# Patient Record
Sex: Female | Born: 2000 | Race: White | Hispanic: No | Marital: Single | Smoking: Never smoker
Health system: Southern US, Community
[De-identification: ages and names within clinical notes are randomized; demographics above are authoritative.]

## PROBLEM LIST (undated history)

## (undated) DIAGNOSIS — F419 Anxiety disorder, unspecified: Secondary | ICD-10-CM

## (undated) DIAGNOSIS — F32A Depression, unspecified: Secondary | ICD-10-CM

## (undated) HISTORY — DX: Depression, unspecified: F32.A

---
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
## 2019-02-15 ENCOUNTER — Other Ambulatory Visit: Payer: Self-pay

## 2019-02-15 ENCOUNTER — Ambulatory Visit (INDEPENDENT_AMBULATORY_CARE_PROVIDER_SITE_OTHER): Payer: 59 | Admitting: Psychiatry

## 2019-02-15 ENCOUNTER — Encounter: Payer: Self-pay | Admitting: Psychiatry

## 2019-02-15 DIAGNOSIS — F411 Generalized anxiety disorder: Secondary | ICD-10-CM

## 2019-02-15 DIAGNOSIS — F3281 Premenstrual dysphoric disorder: Secondary | ICD-10-CM | POA: Diagnosis not present

## 2019-02-15 DIAGNOSIS — F410 Panic disorder [episodic paroxysmal anxiety] without agoraphobia: Secondary | ICD-10-CM | POA: Diagnosis not present

## 2019-02-15 DIAGNOSIS — F902 Attention-deficit hyperactivity disorder, combined type: Secondary | ICD-10-CM | POA: Diagnosis not present

## 2019-02-15 MED ORDER — ESCITALOPRAM OXALATE 20 MG PO TABS: 20.0000 mg | ORAL_TABLET | Freq: Every day | ORAL | 1 refills | Status: DC

## 2019-02-15 NOTE — Progress Notes (Signed)
[facility]  Patient ID: Wendy Cherry,  MRN: [id number]  PCP: Ellene Route  Date of Evaluation: 02/15/2019 Time spent:10 minutes from 1520 to 1530  Chief Complaint:  Chief Complaint    Anxiety; Depression; ADHD; Panic Attack      HISTORY/CURRENT STATUS: Joslyn is provided telemedicine audiovisual appointment session, though she declines the video camera for generalized anxiety and panic disorder, individually with consent with epic collateral for psychiatric interview and exam in 39-month evaluation and management of PMDD, generalized and panic anxiety, and ADHD.  After starting Wellbutrin 6 months ago for ADHD and PMDD, the patient discontinued that as she has her [name] and nortriptyline for headache from neurology and her Klonopin for anxiety.  She denies any recent panic attacks but continues overthinking anxiety.  She left [employer] after a few months returning home now working at [employer] though brother remains at [employer] as a senior due to graduate in the spring.  Patient plans [employer] this next semester in January.  She has no mania, suicidality, psychosis or delirium, while she emphasizes the personal need and documents medical necessity to continue Lexapro for anxiety and depression.   Individual Medical History/ Review of Systems: Changes? :Yes She does continue birth control pill for PCOS and PMDD, in the past having been treated with Metadate, BuSpar, Klonopin, Remeron, Vyvanse, Dextrostat, and Wellbutrin.  Allergies: Patient has no known allergies.  Current Medications:  Current Outpatient Medications:  .  escitalopram (LEXAPRO) 20 MG tablet, Take 1 tablet (20 mg total) by mouth at bedtime., Disp: 90 tablet, Rfl: 1  Medication Side Effects: none  Family Medical/ Social History: Changes? No  MENTAL HEALTH EXAM:  There were no vitals taken for this visit.There is no height or weight on file to calculate BMI. Muscle strengths and tone 5/5, postural  reflexes and gait 0/0, and AIMS = 0 otherwise deferred for coronavirus shutdown  General Appearance: N/A  Eye Contact:  N/A  Speech:  Clear and Coherent, Normal Rate and Talkative  Volume:  Normal  Mood:  Anxious, Dysphoric, Euthymic and Irritable  Affect:  Inappropriate, Full Range and Anxious  Thought Process:  Coherent, Goal Directed, Irrelevant, Linear and Descriptions of Associations: Circumstantial  Orientation:  Full (Time, Place, and Person)  Thought Content: Obsessions and Rumination   Suicidal Thoughts:  No  Homicidal Thoughts:  No  Memory:  Immediate;   Good Remote;   Good  Judgement:  Good  Insight:  Good  Psychomotor Activity:  N/A  Concentration:  Concentration: Fair and Attention Span: Good  Recall:  Good  Fund of Knowledge: Good  Language: Good  Assets:  Desire for Improvement Intimacy Resilience Talents/Skills  ADL's:  Intact  Cognition: WNL  Prognosis:  Good    DIAGNOSES:    ICD-10-CM   1. Generalized anxiety disorder  F41.1 escitalopram (LEXAPRO) 20 MG tablet  2. Premenstrual dysphoric disorder  F32.81 escitalopram (LEXAPRO) 20 MG tablet  3. Panic disorder  F41.0 escitalopram (LEXAPRO) 20 MG tablet  4. Attention deficit hyperactivity disorder (ADHD), combined type, mild  F90.2     Receiving Psychotherapy: No  previously with Vivia Budge, MA   RECOMMENDATIONS: Psychosupportive psychoeducation integrates CBT exposure desensitization thought stopping response prevention for symptom treatment matching with continued Lexapro medication addition to OCP otherwise prescribed.  Lexapro is E scribed 20 mg every bedtime as #90 with 1 refill sent to [facility] service having 2-month supply already in place. She will return for follow-up in 1 year or sooner if  needed.  Virtual Visit via Video Note  I connected with NAVEYA ELLERMAN on 02/15/19 at  3:20 PM EST by a video enabled telemedicine application and verified that I am speaking with the correct person using  two identifiers.  Location: Patient: Individually phone to phone at family residence Provider: [facility]   I discussed the limitations of evaluation and management by telemedicine and the availability of in person appointments. The patient expressed understanding and agreed to proceed.  History of Present Illness:  90-month evaluation and management address PMDD, generalized and panic anxiety, and ADHD.  After starting Wellbutrin 6 months ago for ADHD and PMDD,   Observations/Objective: Mood:  Anxious, Dysphoric, Euthymic and Irritable  Affect:  Inappropriate, Full Range and Anxious  Thought Process:  Coherent, Goal Directed, Irrelevant, Linear and Descriptions of Associations: Circumstantial    Assessment and Plan: Psychosupportive psychoeducation integrates CBT exposure desensitization thought stopping response prevention for symptom treatment matching with continued Lexapro medication addition to OCP otherwise prescribed.  Lexapro is E scribed 20 mg every bedtime as #90 with 1 refill sent to [facility] service having 35-month supply already in place.  Follow Up Instructions: She will return for follow-up in 1 year or sooner if needed..   I discussed the assessment and treatment plan with the patient. The patient was provided an opportunity to ask questions and all were answered. The patient agreed with the plan and demonstrated an understanding of the instructions.   The patient was advised to call back or seek an in-person evaluation if the symptoms worsen or if the condition fails to improve as anticipated.  I provided 10 minutes of non-face-to-face time during this encounter. [place] Web[employer] meeting #[id number] Meeting password: [email or web address]  Chauncey Mann, MD  Chauncey Mann, MD

## 2019-04-11 ENCOUNTER — Ambulatory Visit (INDEPENDENT_AMBULATORY_CARE_PROVIDER_SITE_OTHER): Payer: 59 | Admitting: Psychiatry

## 2019-04-11 ENCOUNTER — Other Ambulatory Visit: Payer: Self-pay

## 2019-04-11 ENCOUNTER — Encounter: Payer: Self-pay | Admitting: Psychiatry

## 2019-04-11 VITALS — Ht 66.0 in | Wt 190.0 lb

## 2019-04-11 DIAGNOSIS — F902 Attention-deficit hyperactivity disorder, combined type: Secondary | ICD-10-CM | POA: Diagnosis not present

## 2019-04-11 DIAGNOSIS — F3281 Premenstrual dysphoric disorder: Secondary | ICD-10-CM

## 2019-04-11 DIAGNOSIS — F410 Panic disorder [episodic paroxysmal anxiety] without agoraphobia: Secondary | ICD-10-CM | POA: Diagnosis not present

## 2019-04-11 DIAGNOSIS — F411 Generalized anxiety disorder: Secondary | ICD-10-CM

## 2019-04-11 MED ORDER — DESVENLAFAXINE SUCCINATE ER 25 MG PO TB24: 25.0000 mg | ORAL_TABLET | Freq: Every day | ORAL | 0 refills | Status: DC

## 2019-04-11 NOTE — Progress Notes (Signed)
[facility]  Patient ID: Wendy Cherry,  MRN: [id number]  PCP: [facility]  Date of Evaluation: 04/11/2019 Time spent:20 minutes from 1340 to 1400  Chief Complaint:  Chief Complaint    Anxiety; Panic Attack; Depression; ADHD      HISTORY/CURRENT STATUS: Wendy Cherry is seen onsite in office 20 minutes face-to-face conjointly with mother with consent with epic collateral for psychiatric interview and exam in 44-month evaluation and management of PMDD, generalized and panic anxiety, and ADHD.  Wendy Cherry as always is significantly stoic and closed to communication about deep-seated emotional issues. but mother clarifies that patient is more depressed, having panic with sleeplessness at night with feeling of presyncope, and is having anger outbursts every 2 to 3 days.  She may not sleep until between 2 and 4 AM taking melatonin as needed over-the-counter and continuing her usual Lexapro 20 mg nightly.  Mother cries wondering if patient is just starting to grieve for the death of father 12/31/15 after which patient and brother gained about 30 pounds then losing it.  Patient started [employer] last August and stayed only 3 weeks while father is to graduate from there this spring.  She started [employer] in January but is having difficulty getting to classes due to her anxiety and depression.  She still works at [employer] doing better at school and work when she is with friends but then getting worse again afterward.  She has seen her providers from [employer] for PCOS endocrinology and migraine neurology.  She gained excessive weight from Remeron in the past though it did help, but she refuses to take it again.  She is not taking Vyvanse or Dextrostat for ADHD.  She stopped nortriptyline from neurology for migraine but remains on birth control pill for PCOS.  She is no longer taking her Klonopin for anxiety and panic.  She did not do well on BuSpar or Wellbutrin in the past seeming to make migraines  worse. Wendy Cherry is negative.  .  She has no mania, suicidalityMother worries the patient may have bipolar though family history includes mother with anxiety and brother with ADHD, psychosis or delirium.  Depression  The patient presents with depression starting more than 8 month ago.   The onset quality is undetermined.   The problem occurs daily.  The problem has been waxing and waning since onset.  Associated symptoms include decreased concentration, fatigue, hopelessness, irritable, restlessness, decreased interest, prolonged grief, and sad.  Associated symptoms include no helplessness, does not have insomnia, no appetite change, no body aches or myalgias, no headaches, no indigestion and no suicidal ideas.     The symptoms are aggravated by social issues, family issues and work stress.  Past treatments include other medications, SSRIs - Selective serotonin reuptake inhibitors and psychotherapy.  Compliance with treatment is variable and good.  Past compliance problems include medication issues, difficulty with treatment plan and medical issues.  Previous treatment provided moderate relief.  Risk factors include a change in medication usage/dosage, family history, family history of mental illness, history of mental illness, major life event and stress.   Past medical history includes chronic illness, recent illness, anxiety, depression and mental health disorder.     Pertinent negatives include no life-threatening condition, no physical disability, no recent psychiatric admission, no bipolar disorder, no eating disorder, no obsessive-compulsive disorder, no post-traumatic stress disorder, no schizophrenia, no suicide attempts and no head trauma.  Individual Medical History/ Review of Systems: Changes? :Yes Weight is up 8 pounds from  last June.  She has a history of vasovagal syncope along with obesity and PCOS.  She did have [facility] for migraine in the past.  Allergies: Patient has no known  allergies.  Current Medications:  Current Outpatient Medications:  .  Desvenlafaxine Succinate ER (PRISTIQ) 25 MG TB24, Take 25 mg by mouth daily after breakfast., Disp: 90 tablet, Rfl: 0 .  escitalopram (LEXAPRO) 20 MG tablet, Take 1 tablet (20 mg total) by mouth at bedtime., Disp: 90 tablet, Rfl: 1  Medication Side Effects: none  Family Medical/ Social History: Changes? Yes she was provided professional statement for accommodations at [facility] in February 2020 copy on chart. Mother worries the patient may have bipolar, but family history includes mother with anxiety and brother with ADHD  MENTAL HEALTH EXAM:  Height 5\' 6"  (1.676 m), weight 190 lb (86.2 kg).Body mass index is 30.67 kg/m. Muscle strengths and tone 5/5, postural reflexes and gait 0/0, and AIMS = 0.  General Appearance: Casual, Fairly Groomed and Guarded  Eye Contact:  Fair  Speech:  Blocked, Clear and Coherent and Normal Rate  Volume:  Normal  Mood:  Anxious, Depressed, Dysphoric, Hopeless and Irritable and Angry  Affect:  Congruent, Depressed, Inappropriate, Restricted and Anxious  Thought Process:  Coherent, Goal Directed, Irrelevant, Linear and Descriptions of Associations: Tangential  Orientation:  Full (Time, Place, and Person)  Thought Content: Ilusions, Rumination and Tangential   Suicidal Thoughts:  No  Homicidal Thoughts:  No  Memory:  Immediate;   Good Remote;   Good  Judgement:  Fair  Insight:  Fair and Lacking  Psychomotor Activity:  Normal, Increased, Mannerisms and Restlessness  Concentration:  Concentration: Fair and Attention Span: Fair  Recall:  Good  Fund of Knowledge: Good  Language: Good to fair  Assets:  Leisure Time Resilience Talents/Skills  ADL's:  Intact  Cognition: WNL  Prognosis:  Fair to good    DIAGNOSES:    ICD-10-CM   1. Generalized anxiety disorder  F41.1 Desvenlafaxine Succinate ER (PRISTIQ) 25 MG TB24  2. Premenstrual dysphoric disorder  F32.81 Desvenlafaxine  Succinate ER (PRISTIQ) 25 MG TB24  3. Panic disorder  F41.0 Desvenlafaxine Succinate ER (PRISTIQ) 25 MG TB24  4. Attention deficit hyperactivity disorder (ADHD), combined type, mild  F90.2 Desvenlafaxine Succinate ER (PRISTIQ) 25 MG TB24    Receiving Psychotherapy: No  but previously withAaronStewart, MA   RECOMMENDATIONS: Psychosupportive psychoeducation integrates cognitive behavioral exposure desensitization thought stopping response prevention for behavioral nutrition, sleep hygiene, object relations and anger management interventions integrating with symptom treatment matching for medications to conclude addition of Pristiq to Lexapro likely best.  She declines Klonopin, Remeron, Neurontin, or Restoril at this time.  Grief therapy such as with hospice is reviewed for access and start up along with returning for therapy with Vivia Budge, MA.  She is E scribed Pristiq 25 mg every morning after breakfast sent as #90 with no refill to [facility] on [address] in [place] for PMDD and generalized anxiety and panic disorder well as ADHD.  She continues current supply of Lexapro 20 mg every bedtime for anxiety and depression and melatonin OTC as needed for insomnia.  She returns for follow-up in 3 months or sooner if needed.  Delight Hoh, MD

## 2019-07-09 ENCOUNTER — Ambulatory Visit: Payer: 59 | Admitting: Psychiatry

## 2019-07-10 ENCOUNTER — Telehealth (INDEPENDENT_AMBULATORY_CARE_PROVIDER_SITE_OTHER): Payer: 59 | Admitting: Psychiatry

## 2019-07-10 ENCOUNTER — Telehealth: Payer: Self-pay | Admitting: Psychiatry

## 2019-07-10 ENCOUNTER — Encounter: Payer: Self-pay | Admitting: Psychiatry

## 2019-07-10 DIAGNOSIS — F3281 Premenstrual dysphoric disorder: Secondary | ICD-10-CM | POA: Diagnosis not present

## 2019-07-10 DIAGNOSIS — F902 Attention-deficit hyperactivity disorder, combined type: Secondary | ICD-10-CM | POA: Diagnosis not present

## 2019-07-10 DIAGNOSIS — F410 Panic disorder [episodic paroxysmal anxiety] without agoraphobia: Secondary | ICD-10-CM | POA: Diagnosis not present

## 2019-07-10 DIAGNOSIS — F411 Generalized anxiety disorder: Secondary | ICD-10-CM

## 2019-07-10 MED ORDER — ESCITALOPRAM OXALATE 20 MG PO TABS: 20.0000 mg | ORAL_TABLET | Freq: Every day | ORAL | 3 refills | Status: DC

## 2019-07-10 NOTE — Telephone Encounter (Signed)
Ms. zanetta, dehaan are scheduled for a virtual visit with your provider today.    Just as we do with appointments in the office, we must obtain your consent to participate.  Your consent will be active for this visit and any virtual visit you may have with one of our providers in the next 365 days.    If you have a MyChart account, I can also send a copy of this consent to you electronically.  All virtual visits are billed to your insurance company just like a traditional visit in the office.  As this is a virtual visit, video technology does not allow for your provider to perform a traditional examination.  This may limit your provider's ability to fully assess your condition.  If your provider identifies any concerns that need to be evaluated in person or the need to arrange testing such as labs, EKG, etc, we will make arrangements to do so.    Although advances in technology are sophisticated, we cannot ensure that it will always work on either your end or our end.  If the connection with a video visit is poor, we may have to switch to a telephone visit.  With either a video or telephone visit, we are not always able to ensure that we have a secure connection.   I need to obtain your verbal consent now.   Are you willing to proceed with your visit today?   ODALY PERI has provided verbal consent on 07/10/2019 for a virtual visit (video or telephone).   Chauncey Mann, MD 07/10/2019  9:58 AM

## 2019-07-10 NOTE — Progress Notes (Addendum)
[facility]  Patient ID: Wendy Cherry,  MRN: [id number]  PCP: Ellene Route  Date of Evaluation: 07/10/2019 Time spent:20 minutes from 1000 to 1020 Chief Complaint:  Chief Complaint    Depression; Anxiety; Panic Attack; ADHD      HISTORY/CURRENT STATUS: Wyolene is provided telemedicine A/V appointment session on MyChart/[employer] platform 20 minutes video to video with consent provided and documented in annual format prior to beginning with epic collateral for psychiatric interview and exam in 59-month evaluation and management of generalized anxiety, premenstrual dysphoric disorder, panic disorder and ADHD.  Some malfunction of audio did occur in the course of the session on my chart so that the appointment was completed for plans by telephone to be certain understood. Mother accompanied patient to last appointment but is at work today not participating therefore.  The patient reviews in the interim receiving a promotion herself at work at [employer] where she has no disruption from her anxiety or mood.  She dropped all classes for her [employer] January semester except American Sign Language a short class though completed.  She is scheduled to restart [facility] in August but offers no personal certainty of mastery this time for assuring she can attend class, but she does well with work.  At last appointment, mother was concerned that the patient had delayed grieving for the death of her father December 21, 2015 with mother in tears also grieving.  The patient is not as certain as mother that her depression and anxiety of last appointment were due to the loss of father.  She has other conflicts in that brother graduates this month from [employer] and she started out there for college but anxiety brought her home.  She has established care with [place] [facility]ology through [facility] for migraine and PCOS treated with birth control pill doing well.  At last appointment the patient  had discontinued all of her antianxiety and ADHD medication refusing any help for sleep but allowing the start up of Pristiq 25 mg for PMDD, panic, and generalized anxiety combined with existing Lexapro 20 mg every bedtime.  The patient took the [employer] for 1 month and then stopped it without telling mother until later when mother encouraged the patient to continue the medication but patient refusing as she considered it not necessary.  Therefore she does not have a clear delineation of cause and course of the consequences of relapse of anxiety and depression but doing better, so she expects to only need the Lexapro now so that discontinue the Pristiq today.  We discuss the possible need for Klonopin as school starts, higher dose of Lexapro, restart of Pristiq, or other options for assuring she can get to class in August. She is not manic, psychotic, delirious, or suicidal.   Depression             The patient presents withdepression starting more than a year ago with exacerbation early 2021. The onset quality is undetermined. The problem occurs daily.The problem has been waxing and waningsince onset.Associated symptoms include decreased concentration,fatigue,restlessness,decreased interest,prolonged grief,and reactive sadness. Associated symptoms include no helplessness, no insomnia,hopelessness, noirritability ,no appetite change,no body aches, no myalgias,no headaches,no indigestionand no suicidal ideas.The symptoms are aggravated by social issues, family issues and work stress.Past treatments include other medications, SSRIs - Selective serotonin reuptake inhibitors and psychotherapy.Compliance with treatment is variable and good.Past compliance problems include medication issues, difficulty with treatment plan and medical issues.Previous treatment provided moderaterelief.Risk factors include a change in medication usage/dosage, family history,  family history of mental  illness, history of mental illness, major life event and stress. Past medical history includes chronic illness,recent illness,anxiety,depressionand mental health disorder. Pertinent negatives include no life-threatening condition,no physical disability,no recent psychiatric admission,no bipolar disorder,no eating disorder,no obsessive-compulsive disorder,no post-traumatic stress disorder,no schizophrenia,no suicide attemptsand no head trauma.  Individual Medical History/ Review of Systems: Changes? :No   Allergies: Patient has no known allergies.  Current Medications:  Current Outpatient Medications:  .  escitalopram (LEXAPRO) 20 MG tablet, Take 1 tablet (20 mg total) by mouth at bedtime., Disp: 90 tablet, Rfl: 3  Medication Side Effects: none  Family Medical/ Social History: Changes? No  MENTAL HEALTH EXAM:  There were no vitals taken for this visit.There is no height or weight on file to calculate BMI.  General Appearance: Casual, Fairly Groomed, Guarded and Obese  Eye Contact:  Fair  Speech:  Clear and Coherent, Normal Rate and Talkative  Volume:  Normal  Mood:  Anxious, Dysphoric, Euthymic and Worthless  Affect:  Congruent, Depressed, Inappropriate and Anxious  Thought Process:  Coherent, Goal Directed, Irrelevant, Linear and Descriptions of Associations: Tangential  Orientation:  Full (Time, Place, and Person)  Thought Content: Ilusions, Rumination and Tangential   Suicidal Thoughts:  No  Homicidal Thoughts:  No  Memory:  Immediate;   Good Remote;   Good  Judgement:  Fair  Insight:  Fair  Psychomotor Activity:  Normal, Increased and Mannerisms  Concentration:  Concentration: Fair and Attention Span: Fair  Recall:  Good  Fund of Knowledge: Good  Language: Good  Assets:  Desire for Improvement Leisure Time Resilience Social Support  ADL's:  Intact  Cognition: WNL  Prognosis:  Fair    DIAGNOSES:    ICD-10-CM   1. Generalized anxiety disorder  F41.1  escitalopram (LEXAPRO) 20 MG tablet  2. Premenstrual dysphoric disorder  F32.81 escitalopram (LEXAPRO) 20 MG tablet  3. Panic disorder  F41.0 escitalopram (LEXAPRO) 20 MG tablet  4. 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  Patient has significant denial and displacement that has been evident since middle school.  She no longer has anger outbursts though in January she did exhibit a couple of outburst of modest intensity a day.  As we review all of these symptoms and consequences for generalizable solutions, the patient maintains she has inward mastery though reassessment prior to starting ACC is important.  Remeron caused too much weight gain in the past and she did not have any sustained benefit or ability to tolerate Vyvanse, Dextrostat, Wellbutrin, BuSpar, Pamelor, or willingness to sustain Klonopin.  Restoril at night may be the next best step in addition to the Lexapro with Cymbalta an additional option.  She is E scribed Lexapro 20 mg every bedtime as 90-day supply and 3 refills sent to [facility] as she will exhaust current supply likely in June.  Pristiq is discontinued at this time. We discuss follow-up here or in therapy if relapse begins again.  Otherwise she follows up in 3 months or sooner if necessary or willing.  [email or web address] [phone]  Chauncey Mann, MD

## 2019-08-20 ENCOUNTER — Other Ambulatory Visit
Admission: RE | Admit: 2019-08-20 | Discharge: 2019-08-20 | Disposition: A | Discharge status: Home / Self Care | Payer: Managed Care, Other (non HMO) | Source: Ambulatory Visit | Attending: Orthopedic Surgery | Admitting: Orthopedic Surgery

## 2019-08-20 ENCOUNTER — Other Ambulatory Visit: Payer: Self-pay

## 2019-08-20 DIAGNOSIS — Z01812 Encounter for preprocedural laboratory examination: Secondary | ICD-10-CM | POA: Diagnosis present

## 2019-08-20 DIAGNOSIS — Z20822 Contact with and (suspected) exposure to covid-19: Secondary | ICD-10-CM | POA: Diagnosis not present

## 2019-08-21 ENCOUNTER — Encounter
Admission: RE | Admit: 2019-08-21 | Discharge: 2019-08-21 | Disposition: A | Discharge status: Home / Self Care | Payer: Managed Care, Other (non HMO) | Source: Ambulatory Visit | Attending: Orthopedic Surgery | Admitting: Orthopedic Surgery

## 2019-08-21 ENCOUNTER — Other Ambulatory Visit: Payer: Self-pay | Admitting: Orthopedic Surgery

## 2019-08-21 HISTORY — DX: Anxiety disorder, unspecified: F41.9

## 2019-08-21 LAB — SARS CORONAVIRUS 2 (TAT 6-24 HRS): SARS Coronavirus 2: NEGATIVE

## 2019-08-21 NOTE — Patient Instructions (Signed)
COVID TESTING Date: 08/20/2019  Testing site:  [facility] [address] Thru Hours:  3:32 am - 1:00 pm Once you are tested, you are asked to stay quarantined (avoiding public places) until after your surgery.   Your procedure is scheduled on: Thursday August 23, 2019 Report to Day Surgery on the 2nd floor of the [facility] To find out your arrival time, please call [phone] between 1PM - 3PM on: Wednesday August 24, 2019  REMEMBER: Instructions that are not followed completely may result in serious medical risk, up to and including death; or upon the discretion of your surgeon and anesthesiologist your surgery may need to be rescheduled.  Do not eat food after midnight the night before surgery.  No gum chewing, lozengers or hard candies.  You may however, drink CLEAR liquids up to 2 hours before you are scheduled to arrive for your surgery. Do not drink anything within 2 hours of your scheduled arrival time.  Clear liquids include: - water  - apple juice without pulp - gatorade (not RED) - black coffee or tea (Do NOT add milk or creamers to the coffee or tea) Do NOT drink anything that is not on this list.  Type 1 and Type 2 diabetics should only drink water.    TAKE THESE MEDICATIONS THE MORNING OF SURGERY WITH A SIP OF WATER: NONE    Stop Anti-inflammatories (NSAIDS) such as Advil, Aleve, Ibuprofen, Motrin, Naproxen, Naprosyn and ASPIRIN  Aspirin based products such as Excedrin, Goodys Powder, BC Powder. (May take Tylenol or Acetaminophen if needed.)  Stop ANY OVER THE COUNTER supplements until after surgery. (May continue Vitamin D, Vitamin B, and multivitamin.)  No Alcohol for 24 hours before or after surgery.  No Smoking including e-cigarettes for 24 hours prior to surgery.  No chewable tobacco products for at least 6 hours prior to surgery.  No nicotine patches on the day of surgery.  Do not use any "recreational" drugs for at  least a week prior to your surgery.  Please be advised that the combination of cocaine and anesthesia may have negative outcomes, up to and including death. If you test positive for cocaine, your surgery will be cancelled.  On the morning of surgery brush your teeth with toothpaste and water, you may rinse your mouth with mouthwash if you wish. Do not swallow any toothpaste or mouthwash.  Do not wear jewelry, make-up, hairpins, clips or nail polish.  Do not wear lotions, powders, or perfumes AND DEODORANT    Do not shave 48 hours prior to surgery.   Contact lenses, hearing aids and dentures may not be worn into surgery.  Do not bring valuables to the hospital. [facility] is not responsible for any missing/lost belongings or valuables.   TAKE A SHOWER DAY OF SURGERY  Notify your doctor if there is any change in your medical condition (cold, fever, infection).  Wear comfortable clothing (specific to your surgery type) to the hospital.  Plan for stool softeners for home use; pain medications have a tendency to cause constipation. You can also help prevent constipation by eating foods high in fiber such as fruits and vegetables and drinking plenty of fluids as your diet allows.  After surgery, you can help prevent lung complications by doing breathing exercises.  Take deep breaths and cough every 1-2 hours. Your doctor may order a device called an Incentive Spirometer to help you take deep breaths. When coughing or sneezing, hold a pillow firmly  against your incision with both hands. This is called "splinting." Doing this helps protect your incision. It also decreases belly discomfort.  If you are being discharged the day of surgery, you will not be allowed to drive home. You will need a responsible adult (18 years or older) to drive you home and stay with you that night.   If you are taking public transportation, you will need to have a responsible adult (18 years or older) with  you. Please confirm with your physician that it is acceptable to use public transportation.   Please call the [place] Dept. at [phone] if you have any questions about these instructions.  Visitation Policy:  Patients undergoing a surgery or procedure may have one family member or support person with them as long as that person is not COVID-19 positive or experiencing its symptoms.  That person may remain in the waiting area during the procedure.  Children under 20 years of age may have both parents or legal guardians with them during their procedure.  Inpatient Visitation Update:   Two designated support people may visit a patient during visiting hours 7 am to 8 pm. It must be the same two designated people for the duration of the patient stay. The visitors may come and go during the day, and there is no switching out to have different visitors. A mask must be worn at all times, including in the patient room.  Children under 75 years of age:  a total of 4 designated visitors for the child's entire stay are allowed. Only 2 in the room at a time and only one staying overnight at a time. The overnight guest can now rotate during the child's hospital stay.  As a reminder, masks are still required for all [facility] team members, patients and visitors in all [facility] facilities.   Systemwide, no visitors 17 or younger.

## 2019-08-22 ENCOUNTER — Other Ambulatory Visit: Payer: Managed Care, Other (non HMO)

## 2019-08-23 ENCOUNTER — Ambulatory Visit: Payer: Managed Care, Other (non HMO) | Admitting: Anesthesiology

## 2019-08-23 ENCOUNTER — Encounter: Payer: Self-pay | Admitting: Orthopedic Surgery

## 2019-08-23 ENCOUNTER — Ambulatory Visit: Payer: Managed Care, Other (non HMO)

## 2019-08-23 ENCOUNTER — Encounter
Admission: RE | Disposition: A | Discharge status: Home / Self Care | Payer: Self-pay | Source: Ambulatory Visit | Attending: Orthopedic Surgery

## 2019-08-23 ENCOUNTER — Ambulatory Visit
Admission: RE | Admit: 2019-08-23 | Discharge: 2019-08-23 | Disposition: A | Discharge status: Home / Self Care | Payer: Managed Care, Other (non HMO) | Source: Ambulatory Visit | Attending: Orthopedic Surgery | Admitting: Orthopedic Surgery

## 2019-08-23 ENCOUNTER — Other Ambulatory Visit: Payer: Self-pay

## 2019-08-23 DIAGNOSIS — F419 Anxiety disorder, unspecified: Secondary | ICD-10-CM | POA: Insufficient documentation

## 2019-08-23 DIAGNOSIS — S43432A Superior glenoid labrum lesion of left shoulder, initial encounter: Secondary | ICD-10-CM | POA: Insufficient documentation

## 2019-08-23 DIAGNOSIS — Z79899 Other long term (current) drug therapy: Secondary | ICD-10-CM | POA: Insufficient documentation

## 2019-08-23 DIAGNOSIS — F329 Major depressive disorder, single episode, unspecified: Secondary | ICD-10-CM | POA: Insufficient documentation

## 2019-08-23 DIAGNOSIS — M25312 Other instability, left shoulder: Secondary | ICD-10-CM | POA: Diagnosis not present

## 2019-08-23 DIAGNOSIS — X58XXXA Exposure to other specified factors, initial encounter: Secondary | ICD-10-CM | POA: Insufficient documentation

## 2019-08-23 DIAGNOSIS — Z419 Encounter for procedure for purposes other than remedying health state, unspecified: Secondary | ICD-10-CM

## 2019-08-23 HISTORY — PX: SHOULDER ARTHROSCOPY WITH LABRAL REPAIR: SHX5691

## 2019-08-23 LAB — CBC WITH DIFFERENTIAL/PLATELET
Abs Immature Granulocytes: 0 10*3/uL (ref 0.00–0.07)
Basophils Absolute: 0 10*3/uL (ref 0.0–0.1)
Basophils Relative: 1 %
Eosinophils Absolute: 0.1 10*3/uL (ref 0.0–0.5)
Eosinophils Relative: 1 %
HCT: 38.2 % (ref 36.0–46.0)
Hemoglobin: 13.1 g/dL (ref 12.0–15.0)
Immature Granulocytes: 0 %
Lymphocytes Relative: 38 %
Lymphs Abs: 2.4 10*3/uL (ref 0.7–4.0)
MCH: 28.5 pg (ref 26.0–34.0)
MCHC: 34.3 g/dL (ref 30.0–36.0)
MCV: 83 fL (ref 80.0–100.0)
Monocytes Absolute: 0.4 10*3/uL (ref 0.1–1.0)
Monocytes Relative: 7 %
Neutro Abs: 3.4 10*3/uL (ref 1.7–7.7)
Neutrophils Relative %: 53 %
Platelets: 221 10*3/uL (ref 150–400)
RBC: 4.6 MIL/uL (ref 3.87–5.11)
RDW: 12.4 % (ref 11.5–15.5)
WBC: 6.2 10*3/uL (ref 4.0–10.5)
nRBC: 0 % (ref 0.0–0.2)

## 2019-08-23 LAB — BASIC METABOLIC PANEL
Anion gap: 7 (ref 5–15)
BUN: 10 mg/dL (ref 6–20)
CO2: 24 mmol/L (ref 22–32)
Calcium: 8.8 mg/dL — ABNORMAL LOW (ref 8.9–10.3)
Chloride: 109 mmol/L (ref 98–111)
Creatinine, Ser: 0.85 mg/dL (ref 0.44–1.00)
GFR calc Af Amer: >60 mL/min (ref >60)
GFR calc non Af Amer: >60 mL/min (ref >60)
Glucose, Bld: 87 mg/dL (ref 70–99)
Potassium: 3.9 mmol/L (ref 3.5–5.1)
Sodium: 140 mmol/L (ref 135–145)

## 2019-08-23 LAB — PROTIME-INR
INR: 1 (ref 0.8–1.2)
Prothrombin Time: 12.7 seconds (ref 11.4–15.2)

## 2019-08-23 LAB — APTT: aPTT: 29 seconds (ref 24–36)

## 2019-08-23 LAB — POCT PREGNANCY, URINE: Preg Test, Ur: NEGATIVE

## 2019-08-23 SURGERY — ARTHROSCOPY, SHOULDER, WITH GLENOID LABRUM REPAIR
Anesthesia: General | Site: Shoulder | Laterality: Left

## 2019-08-23 MED ORDER — CHLORHEXIDINE GLUCONATE CLOTH 2 % EX PADS
6.0000 | MEDICATED_PAD | Freq: Once | CUTANEOUS | Status: AC
  Administered 2019-08-23: 6 via TOPICAL

## 2019-08-23 MED ORDER — LIDOCAINE HCL (PF) 1 % IJ SOLN
INTRAMUSCULAR | Status: AC
  Filled 2019-08-23: qty 5

## 2019-08-23 MED ORDER — ONDANSETRON HCL 4 MG/2ML IJ SOLN: 4.0000 mg | Freq: Once | INTRAMUSCULAR | Status: DC | PRN

## 2019-08-23 MED ORDER — FENTANYL CITRATE (PF) 100 MCG/2ML IJ SOLN
INTRAMUSCULAR | Status: AC
  Filled 2019-08-23: qty 2

## 2019-08-23 MED ORDER — ONDANSETRON HCL 4 MG PO TABS: 4.0000 mg | ORAL_TABLET | Freq: Three times a day (TID) | ORAL | 0 refills | Status: DC | PRN

## 2019-08-23 MED ORDER — BUPIVACAINE HCL (PF) 0.5 % IJ SOLN
INTRAMUSCULAR | Status: AC
  Filled 2019-08-23: qty 10

## 2019-08-23 MED ORDER — CHLORHEXIDINE GLUCONATE 0.12 % MT SOLN: 15.0000 mL | Freq: Once | OROMUCOSAL | Status: AC

## 2019-08-23 MED ORDER — MIDAZOLAM HCL 2 MG/2ML IJ SOLN
INTRAMUSCULAR | Status: AC
  Administered 2019-08-23: 1 mg via INTRAVENOUS
  Filled 2019-08-23: qty 2

## 2019-08-23 MED ORDER — MIDAZOLAM HCL 2 MG/2ML IJ SOLN
INTRAMUSCULAR | Status: AC
  Filled 2019-08-23: qty 2

## 2019-08-23 MED ORDER — CEFAZOLIN SODIUM-DEXTROSE 2-4 GM/100ML-% IV SOLN
2.0000 g | INTRAVENOUS | Status: AC
  Administered 2019-08-23: 2 g via INTRAVENOUS

## 2019-08-23 MED ORDER — FAMOTIDINE 20 MG PO TABS: 20.0000 mg | ORAL_TABLET | Freq: Once | ORAL | Status: AC

## 2019-08-23 MED ORDER — PROPOFOL 10 MG/ML IV BOLUS
INTRAVENOUS | Status: AC
  Filled 2019-08-23: qty 20

## 2019-08-23 MED ORDER — SUGAMMADEX SODIUM 200 MG/2ML IV SOLN
INTRAVENOUS | Status: DC | PRN
  Administered 2019-08-23: 200 mg via INTRAVENOUS

## 2019-08-23 MED ORDER — FAMOTIDINE 20 MG PO TABS
ORAL_TABLET | ORAL | Status: AC
  Administered 2019-08-23: 20 mg via ORAL
  Filled 2019-08-23: qty 1

## 2019-08-23 MED ORDER — BUPIVACAINE LIPOSOME 1.3 % IJ SUSP
INTRAMUSCULAR | Status: AC
  Filled 2019-08-23: qty 20

## 2019-08-23 MED ORDER — ROCURONIUM BROMIDE 100 MG/10ML IV SOLN
INTRAVENOUS | Status: DC | PRN
  Administered 2019-08-23: 20 mg via INTRAVENOUS
  Administered 2019-08-23: 50 mg via INTRAVENOUS

## 2019-08-23 MED ORDER — DEXAMETHASONE SODIUM PHOSPHATE 10 MG/ML IJ SOLN
INTRAMUSCULAR | Status: DC | PRN
  Administered 2019-08-23: 10 mg via INTRAVENOUS

## 2019-08-23 MED ORDER — FENTANYL CITRATE (PF) 100 MCG/2ML IJ SOLN: 25.0000 ug | INTRAMUSCULAR | Status: DC | PRN

## 2019-08-23 MED ORDER — LACTATED RINGERS IV SOLN: INTRAVENOUS | Status: DC

## 2019-08-23 MED ORDER — OXYCODONE HCL 5 MG PO TABS: 5.0000 mg | ORAL_TABLET | ORAL | 0 refills | Status: DC | PRN

## 2019-08-23 MED ORDER — FENTANYL CITRATE (PF) 100 MCG/2ML IJ SOLN
INTRAMUSCULAR | Status: AC
  Administered 2019-08-23: 50 ug via INTRAVENOUS
  Filled 2019-08-23: qty 2

## 2019-08-23 MED ORDER — ONDANSETRON HCL 4 MG/2ML IJ SOLN
INTRAMUSCULAR | Status: DC | PRN
  Administered 2019-08-23: 4 mg via INTRAVENOUS

## 2019-08-23 MED ORDER — MIDAZOLAM HCL 2 MG/2ML IJ SOLN
INTRAMUSCULAR | Status: DC | PRN
  Administered 2019-08-23: 2 mg via INTRAVENOUS

## 2019-08-23 MED ORDER — ORAL CARE MOUTH RINSE: 15.0000 mL | Freq: Once | OROMUCOSAL | Status: AC

## 2019-08-23 MED ORDER — LACTATED RINGERS IV SOLN
INTRAVENOUS | Status: DC | PRN
  Administered 2019-08-23: 4 mL

## 2019-08-23 MED ORDER — PROPOFOL 10 MG/ML IV BOLUS
INTRAVENOUS | Status: DC | PRN
  Administered 2019-08-23: 100 mg via INTRAVENOUS

## 2019-08-23 MED ORDER — LIDOCAINE HCL (CARDIAC) PF 100 MG/5ML IV SOSY
PREFILLED_SYRINGE | INTRAVENOUS | Status: DC | PRN
  Administered 2019-08-23: 60 mg via INTRAVENOUS

## 2019-08-23 MED ORDER — EPHEDRINE SULFATE 50 MG/ML IJ SOLN
INTRAMUSCULAR | Status: DC | PRN
  Administered 2019-08-23: 5 mg via INTRAVENOUS

## 2019-08-23 MED ORDER — CHLORHEXIDINE GLUCONATE CLOTH 2 % EX PADS: 6.0000 | MEDICATED_PAD | Freq: Once | CUTANEOUS | Status: DC

## 2019-08-23 MED ORDER — MIDAZOLAM HCL 2 MG/2ML IJ SOLN
1.0000 mg | INTRAMUSCULAR | Status: AC | PRN
  Administered 2019-08-23: 1 mg via INTRAVENOUS

## 2019-08-23 MED ORDER — CHLORHEXIDINE GLUCONATE 0.12 % MT SOLN
OROMUCOSAL | Status: AC
  Administered 2019-08-23: 15 mL via OROMUCOSAL
  Filled 2019-08-23: qty 15

## 2019-08-23 MED ORDER — CEFAZOLIN SODIUM-DEXTROSE 2-4 GM/100ML-% IV SOLN
INTRAVENOUS | Status: AC
  Filled 2019-08-23: qty 100

## 2019-08-23 MED ORDER — FENTANYL CITRATE (PF) 100 MCG/2ML IJ SOLN
50.0000 ug | INTRAMUSCULAR | Status: AC | PRN
  Administered 2019-08-23 (×2): 50 ug via INTRAVENOUS

## 2019-08-23 SURGICAL SUPPLY — 61 items
ADAPTER IRRIG TUBE 2 SPIKE SOL (ADAPTER) ×4 IMPLANT
ANCHOR SUT BIOC ST 3X145 (Anchor) ×10 IMPLANT
BUR RADIUS 4.0X18.5 (BURR) ×2 IMPLANT
BUR RADIUS 5.5 (BURR) ×2 IMPLANT
CANISTER SUCT LVC 12 LTR MEDI- (MISCELLANEOUS) IMPLANT
CANNULA 5.75X7 CRYSTAL CLEAR (CANNULA) ×4 IMPLANT
CANNULA PARTIAL THREAD 2X7 (CANNULA) ×4 IMPLANT
CANNULA TWIST IN 8.25X9CM (CANNULA) IMPLANT
COOLER POLAR GLACIER W/PUMP (MISCELLANEOUS) ×2 IMPLANT
COVER WAND RF STERILE (DRAPES) ×2 IMPLANT
CRADLE LAMINECT ARM (MISCELLANEOUS) ×2 IMPLANT
DEVICE SUCT BLK HOLE OR FLOOR (MISCELLANEOUS) IMPLANT
DRAPE 3/4 80X56 (DRAPES) ×2 IMPLANT
DRAPE IMP U-DRAPE 54X76 (DRAPES) ×4 IMPLANT
DRAPE INCISE IOBAN 66X45 STRL (DRAPES) ×2 IMPLANT
DRAPE U-SHAPE 47X51 STRL (DRAPES) IMPLANT
DURAPREP 26ML APPLICATOR (WOUND CARE) ×6 IMPLANT
ELECT REM PT RETURN 9FT ADLT (ELECTROSURGICAL)
ELECTRODE REM PT RTRN 9FT ADLT (ELECTROSURGICAL) IMPLANT
GAUZE SPONGE 4X4 12PLY STRL (GAUZE/BANDAGES/DRESSINGS) ×2 IMPLANT
GAUZE XEROFORM 1X8 LF (GAUZE/BANDAGES/DRESSINGS) ×2 IMPLANT
GLOVE BIOGEL PI IND STRL 9 (GLOVE) ×1 IMPLANT
GLOVE BIOGEL PI INDICATOR 9 (GLOVE) ×1
GLOVE SURG 9.0 ORTHO LTXF (GLOVE) ×4 IMPLANT
GOWN STRL REUS TWL 2XL XL LVL4 (GOWN DISPOSABLE) ×2 IMPLANT
GOWN STRL REUS W/ TWL LRG LVL3 (GOWN DISPOSABLE) ×1 IMPLANT
GOWN STRL REUS W/ TWL LRG LVL4 (GOWN DISPOSABLE) ×1 IMPLANT
GOWN STRL REUS W/TWL LRG LVL3 (GOWN DISPOSABLE) ×1
GOWN STRL REUS W/TWL LRG LVL4 (GOWN DISPOSABLE) ×1
IV LACTATED RINGER IRRG 3000ML (IV SOLUTION) ×13
IV LR IRRIG 3000ML ARTHROMATIC (IV SOLUTION) ×13 IMPLANT
KIT STABILIZATION SHOULDER (MISCELLANEOUS) ×2 IMPLANT
KIT SUTURETAK 3.0 INSERT PERC (KITS) ×2 IMPLANT
KIT TURNOVER KIT A (KITS) ×2 IMPLANT
MANIFOLD NEPTUNE II (INSTRUMENTS) ×2 IMPLANT
MASK FACE SPIDER DISP (MASK) ×2 IMPLANT
MAT ABSORB  FLUID 56X50 GRAY (MISCELLANEOUS) ×2
MAT ABSORB FLUID 56X50 GRAY (MISCELLANEOUS) ×2 IMPLANT
NEEDLE HYPO 22GX1.5 SAFETY (NEEDLE) ×2 IMPLANT
PACK SHDR ARTHRO (MISCELLANEOUS) ×2 IMPLANT
PAD WRAPON POLAR SHDR XLG (MISCELLANEOUS) ×1 IMPLANT
SET TUBE SUCT SHAVER OUTFL 24K (TUBING) ×2 IMPLANT
SET TUBE TIP INTRA-ARTICULAR (MISCELLANEOUS) ×2 IMPLANT
STRAP SAFETY 5IN WIDE (MISCELLANEOUS) ×2 IMPLANT
STRIP CLOSURE SKIN 1/2X4 (GAUZE/BANDAGES/DRESSINGS) ×2 IMPLANT
SUT ETHILON 4-0 (SUTURE) ×2
SUT ETHILON 4-0 FS2 18XMFL BLK (SUTURE) ×2
SUT LASSO 90 DEG SD STR (SUTURE) ×2 IMPLANT
SUT MNCRL 4-0 (SUTURE) ×1
SUT MNCRL 4-0 27XMFL (SUTURE) ×1
SUT PDS AB 0 CT1 27 (SUTURE) IMPLANT
SUT VIC AB 0 CT1 36 (SUTURE) IMPLANT
SUT VIC AB 2-0 CT2 27 (SUTURE) IMPLANT
SUTURE ETHLN 4-0 FS2 18XMF BLK (SUTURE) ×2 IMPLANT
SUTURE MAGNUM WIRE 2X48 BLK (SUTURE) IMPLANT
SUTURE MNCRL 4-0 27XMF (SUTURE) ×1 IMPLANT
TAPE MICROFOAM 4IN (TAPE) ×2 IMPLANT
TUBING ARTHRO INFLOW-ONLY STRL (TUBING) ×2 IMPLANT
TUBING CONNECTING 10 (TUBING) ×2 IMPLANT
WAND HAND CNTRL MULTIVAC 90 (MISCELLANEOUS) ×2 IMPLANT
WRAPON POLAR PAD SHDR XLG (MISCELLANEOUS) ×2

## 2019-08-23 NOTE — Discharge Instructions (Signed)
Interscalene Nerve Block with Exparel  1.  For your surgery you have received an Interscalene Nerve Block with Exparel. 2. Nerve Blocks affect many types of nerves, including nerves that control movement, pain and normal sensation.  You may experience feelings such as numbness, tingling, heaviness, weakness or the inability to move your arm or the feeling or sensation that your arm has "fallen asleep". 3. A nerve block with Exparel can last up to 5 days.  Usually the weakness wears off first.  The tingling and heaviness usually wear off next.  Finally you may start to notice pain.  Keep in mind that this may occur in any order.  Once a nerve block starts to wear off it is usually completely gone within 60 minutes. 4. ISNB may cause mild shortness of breath, a hoarse voice, blurry vision, unequal pupils, or drooping of the face on the same side as the nerve block.  These symptoms will usually resolve with the numbness.  Very rarely the procedure itself can cause mild seizures. 5. If needed, your surgeon will give you a prescription for pain medication.  It will take about 60 minutes for the oral pain medication to become fully effective.  So, it is recommended that you start taking this medication before the nerve block first begins to wear off, or when you first begin to feel discomfort. 6. Take your pain medication only as prescribed.  Pain medication can cause sedation and decrease your breathing if you take more than you need for the level of pain that you have. 7. Nausea is a common side effect of many pain medications.  You may want to eat something before taking your pain medicine to prevent nausea. 8. After an Interscalene nerve block, you cannot feel pain, pressure or extremes in temperature in the effected arm.  Because your arm is numb it is at an increased risk for injury.  To decrease the possibility of injury, please practice the following:  a. While you are awake change the position of  your arm frequently to prevent too much pressure on any one area for prolonged periods of time. b.  If you have a cast or tight dressing, check the color or your fingers every couple of hours.  Call your surgeon with the appearance of any discoloration (white or blue). c. If you are given a sling to wear before you go home, please wear it  at all times until the block has completely worn off.  Do not get up at night without your sling. d. Please contact [facility] or your surgeon if you do not begin to regain sensation after 7 days from the surgery.  Anesthesia may be contacted by calling the Same Day Surgery Department, Mon. through Fri., 6 am to 4 pm at [phone].   e. If you experience any other problems or concerns, please contact your surgeon's office. f. If you experience severe or prolonged shortness of breath go to the nearest emergency department.   AMBULATORY SURGERY  DISCHARGE INSTRUCTIONS   1) The drugs that you were given will stay in your system until tomorrow so for the next 24 hours you should not:  A) Drive an automobile B) Make any legal decisions C) Drink any alcoholic beverage   2) You may resume regular meals tomorrow.  Today it is better to start with liquids and gradually work up to solid foods.  You may eat anything you prefer, but it is better to start with liquids, then soup   and crackers, and gradually work up to solid foods.   3) Please notify your doctor immediately if you have any unusual bleeding, trouble breathing, redness and pain at the surgery site, drainage, fever, or pain not relieved by medication.    4) Additional Instructions:        Please contact your physician with any problems or Same Day Surgery at [phone], Monday through Friday 6 am to 4 pm, or [facility] Main number at [phone]. 

## 2019-08-23 NOTE — Op Note (Signed)
08/23/2019  2:44 PM  PATIENT:  Wendy Cherry  19 y.o. 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 female with a diagnosis of Superior Glenoid Labrum Lesion of the left shoulder who failed conservative measures and elected for surgical management.    I discussed the risks and benefits of surgery. The risks include but are not limited to infection, bleeding, nerve or blood vessel injury, joint stiffness or loss of motion, persistent pain, weakness or instability, and hardware failure and the need for further surgery. Medical risks include but are not limited to DVT and pulmonary embolism, myocardial infarction, stroke, pneumonia, respiratory failure and death. Patient understood these risks and wished to proceed.   OPERATIVE IMPLANTS: Arthrex bio suturetak anchors  5   OPERATIVE FINDINGS:  Left shoulder superior labral tear with anterior instability and patulous capsule  OPERATIVE PROCEDURE:  I met with the patient preoperative area.  A pre-op history and physical was performed.  I signed the left shoulder according the hospital's correct site of surgery protocol.  I answered all questions by the patient.An interscalene block with Exparel was performed by the anesthesia service in the preoperative area.   Patient was then brought to the operating room where she underwent general endotracheal intubation.  She was then positioned in a beachchair position. All bony prominences were adequately padded including the lower extremities.  Examination under anesthesia revealed  anterior subluxation of the humeral head with load and shift testing, and a mild sulcus sign testing respectively.  The patient was then prepped and draped in a sterile fashion. The patient received 2 g of Ancef prior to the onset of the case.  A timeout was performed to verify the patient's name, date of birth, medical record number, correct site of surgery and correct procedure to be performed.The timeout was also used to verify the patient received antibiotics that all appropriate instruments, implants and radiographic studies were available in the room. Once all in attendance were in agreement case began.  Bony landmarks were drawn out with a surgical marker along with proposed incisions.  An 11 blade was used to establish a posterior portal through which the arthroscope was placed in the glenohumeral joint. An anterior portal was established under direct visualization using an 18-gauge spinal needle for localization. A 5.75 mm arthroscopic cannula was inserted through the anterior portal. A full diagnostic examination of the glenohumeral joint was performed.  Findings on arthroscopy included superior labral tear involving the biceps anchor with increased anterior laxity and a patulous capsule.  An anterolateral portal was established again under direct visualization using an 18-gauge spinal needle. A 7 mm cannula was placed through this anterolateral portal.  A 4.0 mm resector shaver blade was used to debride the superior, nonarticular glenoid until punctate bleeding was identified in preparation for repair of the superior labrum.  An arthroscopic rasp was used to abrade the anterior capsule    Three Arthrex Bio Suturetak anchors were then placed from the 6:00 to the 9 o'clock position 1 at a time in succession starting with the inferior most anchor.Wendy Cherry   A  90 degree Arthrex suture lasso was then used to shuttle a single limb of each anchor through the anterior inferior capsule.  Arthroscopic knot tying  technique was then used to complete the capsulorrhaphy anteriorly.  Final arthroscopic images of the anterior capsulorrhaphy were taken.  Patient no longer had excessive anterior translation.  The attention was then turned to the superior labrum.  Two superior anchors were placed, one anterior to the biceps attachment and one posterior to the biceps.  The posterior anchor was placed with an Arthrex percutaneous drill kit.  Again a 90 degree Arthrex suture lasso was used to shuttle 1 limb of each anchor under the superior labrum.  Arthroscopic knot tying technique was used to secure the superior labrum to the underlying glenoid.  The superior labral repair and anterior capsulorrhaphy were probed for stability.  Switching sticks were then used to place the arthroscope through the anterior portal for examination of the posterior labrum and capsule.  Patient had no detachment of the posterior labrum.  There was no significant redundancy to the posterior capsule and the patient cannot be subluxed posteriorly under direct visualization.    The glenohumeral joint was then copiously irrigated.  All arthroscopic instruments were removed.  The arthroscope was then placed through the posterior portal into the subacromial space.  Patient was found to have extensive bursitis and a subacromial debridement with a 90 degree ArthroCare wand and 4.0 mm resector shaver blade was performed.  Patient had no evidence of subacromial spurring or AC joint arthrosis on her MRI and therefore no subacromial decompression or distal clavicle excision was performed.  All arthroscopic instruments were removed.  The arthroscopic portals were closed with 4-0 nylon. A dry, sterile dressing was applied to the left shoulder, along with a Polar Care sleeve. Patient's left arm was then placed in an abduction sling.  She was awoken and brought to PACU in stable condition. I was scrubbed and present for the entire case and all sharp and instrument  counts were correct at the conclusion the case. I spoke with the patient's mother in the postop consultation room to let her know the operation was performed successfully and without complication.      Timoteo Gaul, MD

## 2019-08-23 NOTE — Anesthesia Preprocedure Evaluation (Signed)
Anesthesia Evaluation  Patient identified by MRN, date of birth, ID band Patient awake    Reviewed: Allergy & Precautions, NPO status , Patient's Chart, lab work & pertinent test results  Airway Mallampati: II  TM Distance: >3 FB     Dental  (+) Teeth Intact   Pulmonary neg pulmonary ROS,    Pulmonary exam normal        Cardiovascular negative cardio ROS Normal cardiovascular exam     Neuro/Psych PSYCHIATRIC DISORDERS Anxiety Depression negative neurological ROS     GI/Hepatic negative GI ROS, Neg liver ROS,   Endo/Other  negative endocrine ROS  Renal/GU negative Renal ROS  negative genitourinary   Musculoskeletal   Abdominal Normal abdominal exam  (+)   Peds negative pediatric ROS (+)  Hematology negative hematology ROS (+)   Anesthesia Other Findings Past Medical History: No date: Anxiety  Reproductive/Obstetrics                             Anesthesia Physical Anesthesia Plan  ASA: I  Anesthesia Plan: General   Post-op Pain Management:    Induction: Intravenous  PONV Risk Score and Plan:   Airway Management Planned: Oral ETT  Additional Equipment:   Intra-op Plan:   Post-operative Plan: Extubation in OR  Informed Consent: I have reviewed the patients History and Physical, chart, labs and discussed the procedure including the risks, benefits and alternatives for the proposed anesthesia with the patient or authorized representative who has indicated his/her understanding and acceptance.     Dental advisory given  Plan Discussed with: CRNA and Surgeon  Anesthesia Plan Comments:         Anesthesia Quick Evaluation

## 2019-08-23 NOTE — Anesthesia Procedure Notes (Signed)
Procedure Name: Intubation Date/Time: 08/23/2019 11:55 AM Performed by: Lesle Reek, CRNA Pre-anesthesia Checklist: Patient identified, Emergency Drugs available, Suction available and Patient being monitored Patient Re-evaluated:Patient Re-evaluated prior to induction Oxygen Delivery Method: Circle system utilized Preoxygenation: Pre-oxygenation with 100% oxygen Induction Type: IV induction Ventilation: Mask ventilation without difficulty Laryngoscope Size: Mac and 3 Grade View: Grade I Tube type: Oral Tube size: 7.0 mm Number of attempts: 1 Airway Equipment and Method: Stylet Placement Confirmation: ETT inserted through vocal cords under direct vision,  positive ETCO2 and breath sounds checked- equal and bilateral Secured at: 21 cm Tube secured with: Tape Dental Injury: Teeth and Oropharynx as per pre-operative assessment

## 2019-08-23 NOTE — Transfer of Care (Signed)
Immediate Anesthesia Transfer of Care Note  Patient: Wendy Cherry  Procedure(s) Performed: LEFT SHOULDER ARTHROSCOPY WITH LABRAL REPAIR (Left Shoulder)  Patient Location: PACU  Anesthesia Type:General  Level of Consciousness: awake and alert   Airway & Oxygen Therapy: Patient Spontanous Breathing  Post-op Assessment: Report given to RN  Post vital signs: Reviewed and stable  Last Vitals:  Vitals Value Taken Time  BP 120/71 08/23/19 1419  Temp    Pulse 93 08/23/19 1419  Resp 16 08/23/19 1419  SpO2 100 % 08/23/19 1419  Vitals shown include unvalidated device data.  Last Pain:  Vitals:   08/23/19 1014  PainSc: 4          Complications: No complications documented.

## 2019-08-23 NOTE — H&P (Signed)
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 Symptoms of pain, and instability are significantly impairing activities of daily living.  Patient is failed nonoperative management including physical therapy.  Patient's MRI has demonstrated a superior labral tear with inferior displacement.  She has agreed with surgical management.   Past Medical History:  Diagnosis Date   Anxiety    History reviewed. No pertinent surgical history. Social History   Socioeconomic History   Marital status: Single    Spouse name: Not on file   Number of children: Not on file   Years of education: Not on file   Highest education level: Not on file  Occupational History   Not on file  Tobacco Use   Smoking status: Never Smoker   Smokeless tobacco: Never Used  Vaping Use   Vaping Use: Never used  Substance and Sexual Activity   Alcohol use: Never   Drug use: Never   Sexual activity: Never  Other Topics Concern   Not on file  Social History Narrative   Not on file   Social Determinants of Health   Financial Resource Strain:    Difficulty of Paying Living Expenses:   Food Insecurity:    Worried About Programme researcher, broadcasting/film/video in the Last Year:    Barista in the Last Year:   Transportation Needs:    Freight forwarder (Medical):    Lack of Transportation (Non-Medical):   Physical Activity:    Days of Exercise per Week:    Minutes of Exercise per Session:   Stress:    Feeling of Stress :   Social Connections:    Frequency of Communication with Friends and Family:    Frequency of Social Gatherings with Friends and Family:    Attends Religious Services:    Active Member of Clubs or Organizations:    Attends Tax inspector Meetings:    Marital Status:    History reviewed. No pertinent family history. No Known Allergies Prior to Admission medications   Medication Sig Start Date End Date Taking? Authorizing Provider  escitalopram (LEXAPRO) 20 MG tablet Take 1 tablet (20 mg total) by mouth at bedtime. 07/10/19  Yes Chauncey Mann, MD  SPRINTEC 28 0.25-35 MG-MCG tablet Take 1 tablet by mouth at bedtime.  06/21/19  Yes [provider]     Positive ROS: All other systems have been reviewed and were otherwise negative with the exception of those mentioned in the HPI and as above.  Physical Exam: General: Alert, no acute distress Cardiovascular: Regular rate and rhythm, no murmurs rubs or gallops.  No pedal edema Respiratory: Clear to auscultation bilaterally, no wheezes rales or rhonchi. No cyanosis, no use of accessory musculature GI: No organomegaly, abdomen is soft and non-tender nondistended with positive bowel sounds. Skin: Skin intact, no lesions within the operative field. Neurologic: Sensation intact distally Psychiatric: Patient is competent for consent with normal mood and affect Lymphatic: No cervical lymphadenopathy  MUSCULOSKELETAL: Left shoulder: Patient can forward elevate and abduct 160 degrees.  She has pain with O'Brien's test and speeds test.  She has positive impingement signs but no apprehension instability to load-and-shift testing in the office.  Patient has a mild sulcus sign.  She has full digital wrist and elbow range  of motion, intact sensation light touch and palpable radial pulse.  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  I discussed the risks and benefits of surgery. The risks include but are not limited to infection, bleeding,  nerve or blood vessel injury, joint stiffness or loss of motion, persistent pain, weakness or instability, retear of the labrum and  failure of the repair and the need for further surgery. Medical risks include but are not limited to DVT and pulmonary embolism, myocardial infarction, stroke, pneumonia, respiratory failure and death. Patient understood these risks and wished to proceed.     Thornton Park, MD   08/23/2019 11:42 AM

## 2019-08-24 ENCOUNTER — Encounter: Payer: Self-pay | Admitting: Orthopedic Surgery

## 2019-08-24 MED ORDER — BUPIVACAINE LIPOSOME 1.3 % IJ SUSP
INTRAMUSCULAR | Status: DC | PRN
  Administered 2019-08-23: 20 mL

## 2019-08-24 MED ORDER — BUPIVACAINE HCL (PF) 0.5 % IJ SOLN
INTRAMUSCULAR | Status: DC | PRN
  Administered 2019-08-23: 10 mL

## 2019-08-24 NOTE — Anesthesia Postprocedure Evaluation (Signed)
Anesthesia Post Note  Patient: Wendy Cherry  Procedure(s) Performed: LEFT SHOULDER ARTHROSCOPY WITH LABRAL REPAIR (Left Shoulder)  Patient location during evaluation: PACU Anesthesia Type: General Level of consciousness: awake and alert and oriented Pain management: pain level controlled Vital Signs Assessment: post-procedure vital signs reviewed and stable Respiratory status: spontaneous breathing Cardiovascular status: blood pressure returned to baseline Anesthetic complications: no   No complications documented.   Last Vitals:  Vitals:   08/23/19 1517 08/23/19 1527  BP: 111/73 114/71  Pulse: 66 66  Resp: 18 16  Temp: (!) 36.3 C   SpO2: 100% 100%    Last Pain:  Vitals:   08/24/19 0815  TempSrc:   PainSc: 0-No pain                 Ajeet Casasola

## 2019-08-24 NOTE — Anesthesia Procedure Notes (Addendum)
Anesthesia Regional Block: Interscalene brachial plexus block   Pre-Anesthetic Checklist: ,, timeout performed, Correct Patient, Correct Site, Correct Laterality, Correct Procedure, Correct Position, site marked, Risks and benefits discussed,  Surgical consent,  Pre-op evaluation,  At surgeon's request and post-op pain management  Laterality: Left  Prep: alcohol swabs       Needles:  Injection technique: Single-shot  Needle Type: Stimiplex     Needle Length: 5cm  Needle Gauge: 22     Additional Needles:   Procedures:, nerve stimulator,,, ultrasound used (permanent image in chart),,,,   Nerve Stimulator or Paresthesia:  Response: biceps flexion, 0.5 mA,   Additional Responses:   Narrative:  Start time: 08/23/2019 11:10 AM End time: 08/23/2019 11:20 AM Injection made incrementally with aspirations every 5 mL.  Performed by: Personally  Anesthesiologist: Yves Dill, MD  Additional Notes: Functioning IV was confirmed and monitors were applied.  A 18mm 22ga Stimuplex needle was used. Sterile prep and drape,hand hygiene and sterile gloves were used.  Negative aspiration and negative test dose prior to incremental administration of local anesthetic. The patient tolerated the procedure well.  Good spread around nerves.

## 2019-12-26 ENCOUNTER — Encounter: Payer: Self-pay | Admitting: Psychiatry

## 2020-01-02 ENCOUNTER — Other Ambulatory Visit: Payer: Self-pay | Admitting: Acute Care

## 2020-01-02 DIAGNOSIS — R519 Headache, unspecified: Secondary | ICD-10-CM

## 2020-01-16 ENCOUNTER — Other Ambulatory Visit: Payer: Managed Care, Other (non HMO)

## 2020-01-24 ENCOUNTER — Other Ambulatory Visit: Payer: Self-pay

## 2020-01-24 ENCOUNTER — Ambulatory Visit
Admission: RE | Admit: 2020-01-24 | Discharge: 2020-01-24 | Disposition: A | Discharge status: Home / Self Care | Payer: Managed Care, Other (non HMO) | Source: Ambulatory Visit | Attending: Acute Care | Admitting: Acute Care

## 2020-01-24 DIAGNOSIS — R519 Headache, unspecified: Secondary | ICD-10-CM | POA: Insufficient documentation

## 2020-01-27 ENCOUNTER — Other Ambulatory Visit: Payer: Self-pay

## 2020-01-27 ENCOUNTER — Emergency Department
Admission: EM | Admit: 2020-01-27 | Discharge: 2020-01-27 | Disposition: A | Discharge status: Home / Self Care | Payer: Managed Care, Other (non HMO) | Attending: Emergency Medicine | Admitting: Emergency Medicine

## 2020-01-27 DIAGNOSIS — G43909 Migraine, unspecified, not intractable, without status migrainosus: Secondary | ICD-10-CM | POA: Insufficient documentation

## 2020-01-27 DIAGNOSIS — R519 Headache, unspecified: Secondary | ICD-10-CM | POA: Diagnosis present

## 2020-01-27 MED ORDER — DIPHENHYDRAMINE HCL 50 MG/ML IJ SOLN
50.0000 mg | Freq: Once | INTRAMUSCULAR | Status: AC
  Administered 2020-01-27: 50 mg via INTRAVENOUS
  Filled 2020-01-27: qty 1

## 2020-01-27 MED ORDER — KETOROLAC TROMETHAMINE 30 MG/ML IJ SOLN
30.0000 mg | Freq: Once | INTRAMUSCULAR | Status: AC
  Administered 2020-01-27: 30 mg via INTRAVENOUS
  Filled 2020-01-27: qty 1

## 2020-01-27 MED ORDER — SODIUM CHLORIDE 0.9 % IV BOLUS
1000.0000 mL | Freq: Once | INTRAVENOUS | Status: AC
  Administered 2020-01-27: 1000 mL via INTRAVENOUS

## 2020-01-27 MED ORDER — METOCLOPRAMIDE HCL 5 MG/ML IJ SOLN
10.0000 mg | Freq: Once | INTRAMUSCULAR | Status: AC
  Administered 2020-01-27: 10 mg via INTRAVENOUS
  Filled 2020-01-27: qty 2

## 2020-01-27 NOTE — ED Triage Notes (Signed)
First RN Note: pt to ED via POV, pt ambulatory to triage desk, A&O x4, pt states has been being followed by [facility] neurology for migraines. Pt states last night HA was "just worse". Pt visualized in NAD on arrival to ED.

## 2020-01-27 NOTE — Discharge Instructions (Addendum)
Please continue with normal medications and return to the ER for any severe worsening headaches or urgent changes in your health.

## 2020-01-27 NOTE — ED Provider Notes (Signed)
[facility] EMERGENCY DEPARTMENT Provider Note   CSN: [id number] Arrival date & time: 01/27/20  1234     History Chief Complaint  Patient presents with  . Migraine    Wendy Cherry is a 19 y.o. female presents to the emergency department evaluation of of migraine headache.  She has a long history of migraine headaches and is currently under care of neurology.  She had an MRI of her head last week that was normal.  She has been on [name] and nortriptyline which has improved her migraine headaches from twice a week to once a week.  2 days ago she developed a normal migraine headache that has been persistent and not improving.  Pain is slightly more severe than her baseline.  She denies any fevers, trauma, injury.  No other symptoms.  Headache is throbbing and aching along the right forehead and onset was gradual.  She has a little bit of photophobia no nausea or vomiting.  No chest pain shortness of breath or abdominal pain.  HPI     Past Medical History:  Diagnosis Date  . Anxiety     Patient Active Problem List   Diagnosis Date Noted  . Generalized anxiety disorder 08/09/2018  . Premenstrual dysphoric disorder 08/09/2018  . Panic disorder 08/09/2018  . 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 SHOULDER ARTHROSCOPY WITH LABRAL REPAIR Left 08/23/2019   Procedure: LEFT SHOULDER ARTHROSCOPY WITH LABRAL REPAIR;  Surgeon: Juanell Fairly, MD;  Location: [facility] ORS;  Service: Orthopedics;  Laterality: Left;     OB History   No obstetric history on file.     No family history on file.  Social History   Tobacco Use  . Smoking status: Never Smoker  . Smokeless tobacco: Never Used  Vaping Use  . Vaping Use: Never used  Substance Use Topics  . Alcohol use: Never  . Drug use: Never    Home Medications Prior to Admission medications   Medication Sig Start  Date End Date Taking? Authorizing Provider  escitalopram (LEXAPRO) 20 MG tablet Take 1 tablet (20 mg total) by mouth at bedtime. 07/10/19   Chauncey Mann, MD  ondansetron (ZOFRAN) 4 MG tablet Take 1 tablet (4 mg total) by mouth every 8 (eight) hours as needed for nausea or vomiting. 08/23/19   Juanell Fairly, MD  oxyCODONE (OXY IR/ROXICODONE) 5 MG immediate release tablet Take 1 tablet (5 mg total) by mouth every 4 (four) hours as needed. 08/23/19   Juanell Fairly, MD  SPRINTEC 28 0.25-35 MG-MCG tablet Take 1 tablet by mouth at bedtime.  06/21/19   [provider]    Allergies    Patient has no known allergies.  Review of Systems   Review of Systems  Constitutional: Negative for chills and fever.  Eyes: Negative for redness and visual disturbance.  Respiratory: Negative for shortness of breath.   Cardiovascular: Negative for chest pain.  Gastrointestinal: Negative for abdominal pain, nausea and vomiting.  Musculoskeletal: Negative for back pain and neck pain.  Skin: Negative for rash and wound.  Neurological: Positive for headaches. Negative for tremors, seizures, syncope, speech difficulty, weakness and numbness.    Physical Exam Updated Vital Signs BP 126/89 (BP Location: Right Arm)   Pulse (!) 104   Temp 99 F (37.2 C) (Oral)   Resp 16   Ht 5\' 6"  (1.676 m)   Wt 90.7 kg   SpO2  96%   BMI 32.28 kg/m   Physical Exam Constitutional:      Appearance: She is well-developed.  HENT:     Head: Normocephalic and atraumatic.  Eyes:     Extraocular Movements: Extraocular movements intact.     Conjunctiva/sclera: Conjunctivae normal.     Pupils: Pupils are equal, round, and reactive to light.  Cardiovascular:     Rate and Rhythm: Normal rate.  Pulmonary:     Effort: Pulmonary effort is normal. No respiratory distress.  Musculoskeletal:        General: Normal range of motion.     Cervical back: Normal range of motion.  Skin:    General: Skin is warm.     Findings:  No rash.  Neurological:     General: No focal deficit present.     Mental Status: She is alert and oriented to person, place, and time. Mental status is at baseline.     Cranial Nerves: No cranial nerve deficit.     Sensory: No sensory deficit.     Coordination: Coordination normal.  Psychiatric:        Behavior: Behavior normal.        Thought Content: Thought content normal.     ED Results / Procedures / Treatments   Labs (all labs ordered are listed, but only abnormal results are displayed) Labs Reviewed - No data to display  EKG None  Radiology No results found.  Procedures Procedures (including critical care time)  Medications Ordered in ED Medications  sodium chloride 0.9 % bolus 1,000 mL (0 mLs Intravenous Stopped 01/27/20 1434)  ketorolac (TORADOL) 30 MG/ML injection 30 mg (30 mg Intravenous Given 01/27/20 1351)  diphenhydrAMINE (BENADRYL) injection 50 mg (50 mg Intravenous Given 01/27/20 1349)  metoCLOPramide (REGLAN) injection 10 mg (10 mg Intravenous Given 01/27/20 1349)    ED Course  I have reviewed the triage vital signs and the nursing notes.  Pertinent labs & imaging results that were available during my care of the patient were reviewed by me and considered in my medical decision making (see chart for details).    MDM Rules/Calculators/A&P                          19 year old female with history of migraine headaches presents with normal migraine symptoms that are more severe than normal and not responding well to her nortriptyline and [name].  IV fluids initiated along with 30 mg of Toradol IV, 50 mg of Benadryl IV and 10 mg of Reglan IV.  Patient's headache resolved.  She is feeling well.  She is stable and ready for discharge.  She will follow-up as needed. Final Clinical Impression(s) / ED Diagnoses Final diagnoses:  Migraine without status migrainosus, not intractable, unspecified migraine type    Rx / DC Orders ED Discharge Orders    None         Ronnette Juniper 01/27/20 1450    Chesley Noon, MD 01/28/20 1547

## 2020-06-26 DIAGNOSIS — M5481 Occipital neuralgia: Secondary | ICD-10-CM | POA: Insufficient documentation

## 2020-07-21 ENCOUNTER — Other Ambulatory Visit: Payer: Self-pay | Admitting: Orthopedic Surgery

## 2020-07-23 ENCOUNTER — Other Ambulatory Visit
Admission: RE | Admit: 2020-07-23 | Discharge: 2020-07-23 | Disposition: A | Discharge status: Home / Self Care | Payer: Managed Care, Other (non HMO) | Source: Ambulatory Visit | Attending: Orthopedic Surgery | Admitting: Orthopedic Surgery

## 2020-07-23 ENCOUNTER — Other Ambulatory Visit: Payer: Self-pay

## 2020-07-23 NOTE — Patient Instructions (Signed)
Your procedure is scheduled on: Thursday Jul 31, 2020. Report to Day Surgery inside [facility] 2nd floor (stop by admissions desk fist before getting on elevator). To find out your arrival time please call [phone] between 1PM - 3PM on Wednesday Jul 30, 2020.  Remember: Instructions that are not followed completely may result in serious medical risk,  up to and including death, or upon the discretion of your surgeon and anesthesiologist your  surgery may need to be rescheduled.     _X__ 1. Do not eat food or drink fluids after midnight the night before your procedure.                 No chewing gum or hard candies.   __X__2.  On the morning of surgery brush your teeth with toothpaste and water, you                may rinse your mouth with mouthwash if you wish.  Do not swallow any toothpaste of mouthwash.     _X__ 3.  No Alcohol for 24 hours before or after surgery.   _X__ 4.  Do Not Smoke or use e-cigarettes For 24 Hours Prior to Your Surgery.                 Do not use any chewable tobacco products for at least 6 hours prior to                 Surgery.  _X__  5.  Do not use any recreational drugs (marijuana, cocaine, heroin, ecstasy, MDMA or other)                For at least one week prior to your surgery.  Combination of these drugs with anesthesia                May have life threatening results.  __X__ 6.  Notify your doctor if there is any change in your medical condition      (cold, fever, infections).     Do not wear jewelry, make-up, hairpins, clips or nail polish. Do not wear lotions, powders, or perfumes. You may wear deodorant. Do not shave 48 hours prior to surgery. Do not bring valuables to the hospital.    [facility] is not responsible for any belongings or valuables.  Contacts, dentures or bridgework may not be worn into surgery. Leave your suitcase in the car. After surgery it may be brought to your room. For patients admitted to  the hospital, discharge time is determined by your treatment team.   Patients discharged the day of surgery will not be allowed to drive home.   Make arrangements for someone to be with you for the first 24 hours of your Same Day Discharge.   __X__ Take these medicines the morning of surgery with A SIP OF WATER:    1. None   2.   3.   4.  5.  6.  ____ Fleet Enema (as directed)   __X__ Use Antibacterial Soap (or wipes) as directed  ____ Use Benzoyl Peroxide Gel as instructed  ____ Use inhalers on the day of surgery  ____ Stop metformin 2 days prior to surgery    ____ Take 1/2 of usual insulin dose the night before surgery. No insulin the morning          of surgery.   ____ Call your PCP, cardiologist, or Pulmonologist if taking Coumadin/Plavix/aspirin and ask when to stop before your  surgery.   __X__ One Week prior to surgery- Stop Anti-inflammatories such as Ibuprofen, Aleve, Advil, Motrin, meloxicam (MOBIC), diclofenac, etodolac, ketorolac, Toradol, Daypro, piroxicam, Goody's or BC powders. OK TO USE TYLENOL IF NEEDED   __X__ Stop supplements until after surgery.    ____ Bring C-Pap to the hospital.    If you have any questions regarding your pre-procedure instructions,  Please call Pre-admit Testing at [phone].

## 2020-07-29 ENCOUNTER — Other Ambulatory Visit: Admission: RE | Admit: 2020-07-29 | Payer: Managed Care, Other (non HMO) | Source: Ambulatory Visit

## 2020-07-30 MED ORDER — CEFAZOLIN SODIUM-DEXTROSE 2-4 GM/100ML-% IV SOLN
2.0000 g | INTRAVENOUS | Status: AC
  Administered 2020-07-31: 2 g via INTRAVENOUS

## 2020-07-30 MED ORDER — LACTATED RINGERS IV SOLN: INTRAVENOUS | Status: DC

## 2020-07-30 MED ORDER — CHLORHEXIDINE GLUCONATE CLOTH 2 % EX PADS
6.0000 | MEDICATED_PAD | Freq: Once | CUTANEOUS | Status: AC
  Administered 2020-07-31: 6 via TOPICAL

## 2020-07-30 MED ORDER — ORAL CARE MOUTH RINSE: 15.0000 mL | Freq: Once | OROMUCOSAL | Status: AC

## 2020-07-30 MED ORDER — CHLORHEXIDINE GLUCONATE 0.12 % MT SOLN: 15.0000 mL | Freq: Once | OROMUCOSAL | Status: AC

## 2020-07-30 MED ORDER — FAMOTIDINE 20 MG PO TABS: 20.0000 mg | ORAL_TABLET | Freq: Once | ORAL | Status: AC

## 2020-07-31 ENCOUNTER — Ambulatory Visit: Payer: Managed Care, Other (non HMO)

## 2020-07-31 ENCOUNTER — Ambulatory Visit
Admission: RE | Admit: 2020-07-31 | Discharge: 2020-07-31 | Disposition: A | Discharge status: Home / Self Care | Payer: Managed Care, Other (non HMO) | Attending: Orthopedic Surgery | Admitting: Orthopedic Surgery

## 2020-07-31 ENCOUNTER — Other Ambulatory Visit: Payer: Self-pay

## 2020-07-31 ENCOUNTER — Ambulatory Visit: Payer: Managed Care, Other (non HMO) | Admitting: Certified Registered Nurse Anesthetist

## 2020-07-31 ENCOUNTER — Encounter
Admission: RE | Disposition: A | Discharge status: Home / Self Care | Payer: Self-pay | Source: Home / Self Care | Attending: Orthopedic Surgery

## 2020-07-31 ENCOUNTER — Encounter: Payer: Self-pay | Admitting: Orthopedic Surgery

## 2020-07-31 DIAGNOSIS — Z419 Encounter for procedure for purposes other than remedying health state, unspecified: Secondary | ICD-10-CM

## 2020-07-31 DIAGNOSIS — Z79899 Other long term (current) drug therapy: Secondary | ICD-10-CM | POA: Insufficient documentation

## 2020-07-31 DIAGNOSIS — Z793 Long term (current) use of hormonal contraceptives: Secondary | ICD-10-CM | POA: Diagnosis not present

## 2020-07-31 DIAGNOSIS — X58XXXD Exposure to other specified factors, subsequent encounter: Secondary | ICD-10-CM | POA: Diagnosis not present

## 2020-07-31 DIAGNOSIS — S43432D Superior glenoid labrum lesion of left shoulder, subsequent encounter: Secondary | ICD-10-CM | POA: Insufficient documentation

## 2020-07-31 HISTORY — PX: SHOULDER ARTHROSCOPY WITH LABRAL REPAIR: SHX5691

## 2020-07-31 LAB — POCT PREGNANCY, URINE: Preg Test, Ur: NEGATIVE

## 2020-07-31 SURGERY — ARTHROSCOPY, SHOULDER, WITH GLENOID LABRUM REPAIR
Anesthesia: General | Site: Shoulder | Laterality: Left

## 2020-07-31 MED ORDER — ROCURONIUM BROMIDE 100 MG/10ML IV SOLN
INTRAVENOUS | Status: DC | PRN
  Administered 2020-07-31: 50 mg via INTRAVENOUS

## 2020-07-31 MED ORDER — KETOROLAC TROMETHAMINE 30 MG/ML IJ SOLN
INTRAMUSCULAR | Status: AC
  Filled 2020-07-31: qty 1

## 2020-07-31 MED ORDER — LIDOCAINE HCL (PF) 1 % IJ SOLN
INTRAMUSCULAR | Status: AC
  Filled 2020-07-31: qty 5

## 2020-07-31 MED ORDER — BUPIVACAINE LIPOSOME 1.3 % IJ SUSP
INTRAMUSCULAR | Status: AC
  Filled 2020-07-31: qty 20

## 2020-07-31 MED ORDER — MIDAZOLAM HCL 2 MG/2ML IJ SOLN
INTRAMUSCULAR | Status: AC
  Administered 2020-07-31: 1 mg via INTRAVENOUS
  Filled 2020-07-31: qty 2

## 2020-07-31 MED ORDER — BUPIVACAINE LIPOSOME 1.3 % IJ SUSP
INTRAMUSCULAR | Status: DC | PRN
  Administered 2020-07-31: 20 mL via PERINEURAL

## 2020-07-31 MED ORDER — BUPIVACAINE HCL (PF) 0.5 % IJ SOLN
INTRAMUSCULAR | Status: AC
  Filled 2020-07-31: qty 10

## 2020-07-31 MED ORDER — FAMOTIDINE 20 MG PO TABS
ORAL_TABLET | ORAL | Status: AC
  Administered 2020-07-31: 20 mg via ORAL
  Filled 2020-07-31: qty 1

## 2020-07-31 MED ORDER — NEOMYCIN-POLYMYXIN B GU 40-200000 IR SOLN
Status: DC | PRN
  Administered 2020-07-31: 2 mL

## 2020-07-31 MED ORDER — LIDOCAINE HCL (CARDIAC) PF 100 MG/5ML IV SOSY
PREFILLED_SYRINGE | INTRAVENOUS | Status: DC | PRN
  Administered 2020-07-31: 80 mg via INTRAVENOUS

## 2020-07-31 MED ORDER — ONDANSETRON HCL 4 MG/2ML IJ SOLN
INTRAMUSCULAR | Status: AC
  Filled 2020-07-31: qty 2

## 2020-07-31 MED ORDER — ATROPINE SULFATE 0.4 MG/ML IJ SOLN
INTRAMUSCULAR | Status: AC
  Filled 2020-07-31: qty 1

## 2020-07-31 MED ORDER — LIDOCAINE HCL (PF) 1 % IJ SOLN
INTRAMUSCULAR | Status: DC | PRN
  Administered 2020-07-31: 5 mL via SUBCUTANEOUS

## 2020-07-31 MED ORDER — ROCURONIUM BROMIDE 10 MG/ML (PF) SYRINGE
PREFILLED_SYRINGE | INTRAVENOUS | Status: AC
  Filled 2020-07-31: qty 10

## 2020-07-31 MED ORDER — FENTANYL CITRATE (PF) 100 MCG/2ML IJ SOLN
INTRAMUSCULAR | Status: AC
  Administered 2020-07-31: 50 ug via INTRAVENOUS
  Filled 2020-07-31: qty 2

## 2020-07-31 MED ORDER — OXYCODONE HCL 5 MG PO TABS: 5.0000 mg | ORAL_TABLET | ORAL | 0 refills | Status: DC | PRN

## 2020-07-31 MED ORDER — CHLORHEXIDINE GLUCONATE 0.12 % MT SOLN
OROMUCOSAL | Status: AC
  Administered 2020-07-31: 15 mL via OROMUCOSAL
  Filled 2020-07-31: qty 15

## 2020-07-31 MED ORDER — PROMETHAZINE HCL 25 MG/ML IJ SOLN: 6.2500 mg | INTRAMUSCULAR | Status: DC | PRN

## 2020-07-31 MED ORDER — PROPOFOL 10 MG/ML IV BOLUS
INTRAVENOUS | Status: AC
  Filled 2020-07-31: qty 20

## 2020-07-31 MED ORDER — EPHEDRINE SULFATE 50 MG/ML IJ SOLN
INTRAMUSCULAR | Status: DC | PRN
  Administered 2020-07-31: 7.5 mg via INTRAVENOUS

## 2020-07-31 MED ORDER — ONDANSETRON HCL 4 MG/2ML IJ SOLN
INTRAMUSCULAR | Status: DC | PRN
  Administered 2020-07-31: 4 mg via INTRAVENOUS

## 2020-07-31 MED ORDER — ONDANSETRON HCL 4 MG PO TABS: 4.0000 mg | ORAL_TABLET | Freq: Three times a day (TID) | ORAL | 0 refills | Status: DC | PRN

## 2020-07-31 MED ORDER — FENTANYL CITRATE (PF) 100 MCG/2ML IJ SOLN
INTRAMUSCULAR | Status: DC | PRN
  Administered 2020-07-31: 50 ug via INTRAVENOUS

## 2020-07-31 MED ORDER — ACETAMINOPHEN 10 MG/ML IV SOLN
INTRAVENOUS | Status: DC | PRN
  Administered 2020-07-31: 1000 mg via INTRAVENOUS

## 2020-07-31 MED ORDER — PHENYLEPHRINE HCL (PRESSORS) 10 MG/ML IV SOLN
INTRAVENOUS | Status: DC | PRN
  Administered 2020-07-31: 200 ug via INTRAVENOUS

## 2020-07-31 MED ORDER — BUPIVACAINE HCL (PF) 0.5 % IJ SOLN
INTRAMUSCULAR | Status: DC | PRN
  Administered 2020-07-31: 10 mL via PERINEURAL

## 2020-07-31 MED ORDER — LACTATED RINGERS IV SOLN
INTRAVENOUS | Status: DC | PRN
  Administered 2020-07-31 (×4): 3001 mL

## 2020-07-31 MED ORDER — EPHEDRINE 5 MG/ML INJ
INTRAVENOUS | Status: AC
  Filled 2020-07-31: qty 10

## 2020-07-31 MED ORDER — FENTANYL CITRATE (PF) 100 MCG/2ML IJ SOLN
50.0000 ug | INTRAMUSCULAR | Status: DC | PRN
Start: 2020-07-31 — End: 2020-07-31

## 2020-07-31 MED ORDER — DEXMEDETOMIDINE (PRECEDEX) IN NS 20 MCG/5ML (4 MCG/ML) IV SYRINGE
PREFILLED_SYRINGE | INTRAVENOUS | Status: AC
  Filled 2020-07-31: qty 5

## 2020-07-31 MED ORDER — SUGAMMADEX SODIUM 200 MG/2ML IV SOLN
INTRAVENOUS | Status: DC | PRN
  Administered 2020-07-31: 200 mg via INTRAVENOUS

## 2020-07-31 MED ORDER — PROPOFOL 10 MG/ML IV BOLUS
INTRAVENOUS | Status: DC | PRN
  Administered 2020-07-31: 200 mg via INTRAVENOUS

## 2020-07-31 MED ORDER — MIDAZOLAM HCL 2 MG/2ML IJ SOLN
INTRAMUSCULAR | Status: AC
  Filled 2020-07-31: qty 2

## 2020-07-31 MED ORDER — MIDAZOLAM HCL 2 MG/2ML IJ SOLN: 1.0000 mg | INTRAMUSCULAR | Status: DC | PRN

## 2020-07-31 MED ORDER — MIDAZOLAM HCL 2 MG/2ML IJ SOLN
INTRAMUSCULAR | Status: DC | PRN
  Administered 2020-07-31: 2 mg via INTRAVENOUS

## 2020-07-31 MED ORDER — CEFAZOLIN SODIUM-DEXTROSE 2-4 GM/100ML-% IV SOLN
INTRAVENOUS | Status: AC
  Filled 2020-07-31: qty 100

## 2020-07-31 MED ORDER — FENTANYL CITRATE (PF) 100 MCG/2ML IJ SOLN: 25.0000 ug | INTRAMUSCULAR | Status: DC | PRN

## 2020-07-31 MED ORDER — DEXMEDETOMIDINE HCL 200 MCG/2ML IV SOLN
INTRAVENOUS | Status: DC | PRN
  Administered 2020-07-31: 8 ug via INTRAVENOUS
  Administered 2020-07-31: 12 ug via INTRAVENOUS

## 2020-07-31 MED ORDER — DEXAMETHASONE SODIUM PHOSPHATE 10 MG/ML IJ SOLN
INTRAMUSCULAR | Status: DC | PRN
  Administered 2020-07-31: 8 mg via INTRAVENOUS

## 2020-07-31 MED ORDER — DEXAMETHASONE SODIUM PHOSPHATE 10 MG/ML IJ SOLN
INTRAMUSCULAR | Status: AC
  Filled 2020-07-31: qty 1

## 2020-07-31 MED ORDER — LIDOCAINE HCL (PF) 2 % IJ SOLN
INTRAMUSCULAR | Status: AC
  Filled 2020-07-31: qty 4

## 2020-07-31 MED ORDER — FENTANYL CITRATE (PF) 250 MCG/5ML IJ SOLN
INTRAMUSCULAR | Status: AC
  Filled 2020-07-31: qty 5

## 2020-07-31 SURGICAL SUPPLY — 62 items
ADAPTER IRRIG TUBE 2 SPIKE SOL (ADAPTER) ×4 IMPLANT
ADPR TBG 2 SPK PMP STRL ASCP (ADAPTER) ×2
ANCH SUT 2 SUTTK 14.5X3 (Anchor) ×5 IMPLANT
ANCHOR SUT BIOC ST 3X145 (Anchor) ×10 IMPLANT
BUR RADIUS 4.0X18.5 (BURR) ×2 IMPLANT
BUR RADIUS 5.5 (BURR) ×2 IMPLANT
CANISTER SUCT LVC 12 LTR MEDI- (MISCELLANEOUS) IMPLANT
CANNULA 5.75X7 CRYSTAL CLEAR (CANNULA) ×4 IMPLANT
CANNULA PARTIAL THREAD 2X7 (CANNULA) ×4 IMPLANT
CANNULA TWIST IN 8.25X9CM (CANNULA) IMPLANT
COOLER POLAR GLACIER W/PUMP (MISCELLANEOUS) ×2 IMPLANT
COVER WAND RF STERILE (DRAPES) ×2 IMPLANT
DEVICE SUCT BLK HOLE OR FLOOR (MISCELLANEOUS) IMPLANT
DRAPE 3/4 80X56 (DRAPES) ×2 IMPLANT
DRAPE IMP U-DRAPE 54X76 (DRAPES) ×4 IMPLANT
DRAPE INCISE IOBAN 66X45 STRL (DRAPES) ×2 IMPLANT
DRAPE U-SHAPE 47X51 STRL (DRAPES) IMPLANT
DURAPREP 26ML APPLICATOR (WOUND CARE) ×6 IMPLANT
ELECT REM PT RETURN 9FT ADLT (ELECTROSURGICAL)
ELECTRODE REM PT RTRN 9FT ADLT (ELECTROSURGICAL) IMPLANT
GAUZE SPONGE 4X4 12PLY STRL (GAUZE/BANDAGES/DRESSINGS) ×2 IMPLANT
GAUZE XEROFORM 1X8 LF (GAUZE/BANDAGES/DRESSINGS) ×2 IMPLANT
GLOVE SURG ORTHO LTX SZ9 (GLOVE) ×4 IMPLANT
GLOVE SURG UNDER POLY LF SZ9 (GLOVE) ×2 IMPLANT
GOWN STRL REUS TWL 2XL XL LVL4 (GOWN DISPOSABLE) ×2 IMPLANT
GOWN STRL REUS W/ TWL LRG LVL3 (GOWN DISPOSABLE) ×1 IMPLANT
GOWN STRL REUS W/ TWL LRG LVL4 (GOWN DISPOSABLE) ×1 IMPLANT
GOWN STRL REUS W/TWL LRG LVL3 (GOWN DISPOSABLE) ×2
GOWN STRL REUS W/TWL LRG LVL4 (GOWN DISPOSABLE) ×2
IV LACTATED RINGER IRRG 3000ML (IV SOLUTION) ×12
IV LR IRRIG 3000ML ARTHROMATIC (IV SOLUTION) ×6 IMPLANT
KIT STABILIZATION SHOULDER (MISCELLANEOUS) ×2 IMPLANT
KIT SUTURETAK 3.0 INSERT PERC (KITS) ×2 IMPLANT
KIT TURNOVER KIT A (KITS) ×2 IMPLANT
MANIFOLD NEPTUNE II (INSTRUMENTS) ×4 IMPLANT
MASK FACE SPIDER DISP (MASK) ×2 IMPLANT
MAT ABSORB  FLUID 56X50 GRAY (MISCELLANEOUS) ×2
MAT ABSORB FLUID 56X50 GRAY (MISCELLANEOUS) ×2 IMPLANT
NEEDLE HYPO 22GX1.5 SAFETY (NEEDLE) ×2 IMPLANT
PACK ARTHROSCOPY SHOULDER (MISCELLANEOUS) ×2 IMPLANT
PAD ARMBOARD 7.5X6 YLW CONV (MISCELLANEOUS) ×2 IMPLANT
PAD WRAPON POLAR SHDR XLG (MISCELLANEOUS) ×1 IMPLANT
SET TUBE SUCT SHAVER OUTFL 24K (TUBING) ×2 IMPLANT
SET TUBE TIP INTRA-ARTICULAR (MISCELLANEOUS) ×2 IMPLANT
STRAP SAFETY 5IN WIDE (MISCELLANEOUS) ×2 IMPLANT
STRIP CLOSURE SKIN 1/2X4 (GAUZE/BANDAGES/DRESSINGS) ×2 IMPLANT
SUT ETHILON 4-0 (SUTURE) ×2
SUT ETHILON 4-0 FS2 18XMFL BLK (SUTURE) ×1
SUT LASSO 90 DEG SD STR (SUTURE) ×2 IMPLANT
SUT MNCRL 4-0 (SUTURE) ×2
SUT MNCRL 4-0 27XMFL (SUTURE) ×1
SUT PDS AB 0 CT1 27 (SUTURE) IMPLANT
SUT ULTRABRAID 2 COBRAID 38 (SUTURE) IMPLANT
SUT VIC AB 0 CT1 36 (SUTURE) IMPLANT
SUT VIC AB 2-0 CT2 27 (SUTURE) IMPLANT
SUTURE ETHLN 4-0 FS2 18XMF BLK (SUTURE) ×1 IMPLANT
SUTURE MNCRL 4-0 27XMF (SUTURE) ×1 IMPLANT
TAPE MICROFOAM 4IN (TAPE) ×2 IMPLANT
TUBING ARTHRO INFLOW-ONLY STRL (TUBING) ×2 IMPLANT
TUBING CONNECTING 10 (TUBING) ×2 IMPLANT
WAND WEREWOLF FLOW 90D (MISCELLANEOUS) ×2 IMPLANT
WRAPON POLAR PAD SHDR XLG (MISCELLANEOUS) ×2

## 2020-07-31 NOTE — Anesthesia Preprocedure Evaluation (Signed)
Anesthesia Evaluation  Patient identified by MRN, date of birth, ID band Patient awake    Reviewed: Allergy & Precautions, NPO status , Patient's Chart, lab work & pertinent test results  History of Anesthesia Complications Negative for: history of anesthetic complications  Airway Mallampati: II  TM Distance: >3 FB     Dental  (+) Teeth Intact, Dental Advidsory Given   Pulmonary neg pulmonary ROS,    Pulmonary exam normal        Cardiovascular negative cardio ROS Normal cardiovascular exam     Neuro/Psych PSYCHIATRIC DISORDERS Anxiety Depression negative neurological ROS     GI/Hepatic negative GI ROS, Neg liver ROS,   Endo/Other  negative endocrine ROS  Renal/GU negative Renal ROS  negative genitourinary   Musculoskeletal   Abdominal Normal abdominal exam  (+)   Peds negative pediatric ROS (+)  Hematology negative hematology ROS (+)   Anesthesia Other Findings Past Medical History: No date: Anxiety  Reproductive/Obstetrics negative OB ROS                             Anesthesia Physical  Anesthesia Plan  ASA: II  Anesthesia Plan: General   Post-op Pain Management:  Regional for Post-op pain   Induction: Intravenous  PONV Risk Score and Plan: 3 and Ondansetron, Dexamethasone, Midazolam, Treatment may vary due to age or medical condition and Promethazine  Airway Management Planned: Oral ETT  Additional Equipment:   Intra-op Plan:   Post-operative Plan: Extubation in OR  Informed Consent: I have reviewed the patients History and Physical, chart, labs and discussed the procedure including the risks, benefits and alternatives for the proposed anesthesia with the patient or authorized representative who has indicated his/her understanding and acceptance.     Dental advisory given  Plan Discussed with: CRNA and Surgeon  Anesthesia Plan Comments:         Anesthesia  Quick Evaluation

## 2020-07-31 NOTE — Transfer of Care (Signed)
Immediate Anesthesia Transfer of Care Note  Patient: Wendy Cherry  Procedure(s) Performed: LEFT SHOULDER ARTHROSCOPY WITH REVISION LABRAL REPAIR AND POSSIBLE BICEPS TENODESIS (Left Shoulder)  Patient Location: PACU  Anesthesia Type:General  Level of Consciousness: drowsy  Airway & Oxygen Therapy: Patient Spontanous Breathing and Patient connected to face mask oxygen  Post-op Assessment: Report given to RN and Post -op Vital signs reviewed and stable  Post vital signs: Reviewed and stable  Last Vitals:  Vitals Value Taken Time  BP 117/85 07/31/20 1645  Temp    Pulse 80 07/31/20 1652  Resp 16 07/31/20 1652  SpO2 100 % 07/31/20 1652  Vitals shown include unvalidated device data.  Last Pain:  Vitals:   07/31/20 1148  TempSrc: Temporal  PainSc: 3          Complications: No complications documented.

## 2020-07-31 NOTE — Anesthesia Procedure Notes (Signed)
Anesthesia Regional Block: Interscalene brachial plexus block   Pre-Anesthetic Checklist: ,, timeout performed, Correct Patient, Correct Site, Correct Laterality, Correct Procedure, Correct Position, site marked, Risks and benefits discussed,  Surgical consent,  Pre-op evaluation,  At surgeon's request and post-op pain management  Laterality: Left and Upper  Prep: chloraprep       Needles:  Injection technique: Single-shot  Needle Type: Stimiplex     Needle Length: 5cm  Needle Gauge: 22     Additional Needles:   Procedures:,,,, ultrasound used (permanent image in chart),,,,   Nerve Stimulator or Paresthesia:  Response: biceps flexion,   Additional Responses:   Narrative:  Start time: 07/31/2020 1:34 PM End time: 07/31/2020 1:37 PM Injection made incrementally with aspirations every 5 mL.  Performed by: Personally  Anesthesiologist: Lenard Simmer, MD  Additional Notes: Functioning IV was confirmed and monitors were applied.  A 11mm 22ga Stimuplex needle was used. Sterile prep and drape,hand hygiene and sterile gloves were used.  Negative aspiration and negative test dose prior to incremental administration of local anesthetic. The patient tolerated the procedure well.

## 2020-07-31 NOTE — Anesthesia Procedure Notes (Signed)
Procedure Name: Intubation Date/Time: 07/31/2020 2:20 PM Performed by: Ginger Carne, CRNA Pre-anesthesia Checklist: Patient identified, Emergency Drugs available, Suction available, Patient being monitored and Timeout performed Patient Re-evaluated:Patient Re-evaluated prior to induction Oxygen Delivery Method: Circle system utilized Preoxygenation: Pre-oxygenation with 100% oxygen Induction Type: IV induction Ventilation: Mask ventilation without difficulty Laryngoscope Size: McGraph and 3 Grade View: Grade I Tube type: Oral Tube size: 7.5 mm Number of attempts: 1 Airway Equipment and Method: Stylet,  Video-laryngoscopy and Oral airway Placement Confirmation: ETT inserted through vocal cords under direct vision and positive ETCO2 Secured at: 22 cm Tube secured with: Tape Dental Injury: Teeth and Oropharynx as per pre-operative assessment

## 2020-07-31 NOTE — Op Note (Addendum)
07/31/2020  5:23 PM  PATIENT:  Wendy Cherry  20 y.o. 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 female with a diagnosis of SLAP Tear who failed conservative measures and elected for surgical management.    I discussed the risks and benefits of surgery. The risks include but are not limited to infection, bleeding, nerve or blood vessel injury, joint stiffness or loss of motion, persistent pain, weakness or instability, and hardware failure and the need for further surgery. Medical risks include but are not limited to DVT and pulmonary embolism, myocardial infarction, stroke, pneumonia, respiratory failure and death. Patient understood these risks and wished to proceed.   OPERATIVE IMPLANTS: Arthrex bio suturetak anchors  5   OPERATIVE FINDINGS:  Left shoulder multidirectional instability with diminutive posterior labrum and patulous capsule.  SLAP repair from her previous surgery was intact as was her previous anterior capsulorrhaphy.  Patient had no evidence of a biceps tendon tear.  OPERATIVE PROCEDURE:  I met with the patient preoperative area.  A pre-op history and physical was performed.  I signed the left shoulder according the hospital's correct site of surgery protocol.  I answered all questions by the patient.  An interscalene block with Exparel was performed by the anesthesia service in the preoperative area.  Patient was then brought to the operating room. She  underwent general  endotracheal intubation. The patient was then positioned in a beachchair position. All bony prominences were adequately padded including the lower extremities.  Examination under anesthesia revealed significant instability to the left shoulder with load and shift testing, increased anterior and posterior translation.   Patient did not have a sulcus sign.  The patient was then prepped and draped in a sterile fashion. The patient received 2 g of Ancef IV prior to the onset of the case.  A timeout was performed to verify the patient's name, date of birth, medical record number, correct site of surgery and correct procedure to be performed.The timeout was also used to verify the patient received antibiotics that all appropriate instruments, implants and radiographic studies were available in the room. Once all in attendance were in agreement case began.  Bony landmarks were drawn out with a surgical marker along with proposed incisions.  An 11 blade was used to establish a posterior portal through which the arthroscope was placed in the glenohumeral joint. An anterior portal was established under direct visualization using an 18-gauge spinal needle for localization. A 5.75 mm arthroscopic cannula was inserted through the anterior portal. A full diagnostic examination of the glenohumeral joint was performed.  Findings on arthroscopy included anterior and posterior patulous capsule with an diminutive posterior labrum.  Previous SLAP repair was intact as was the previous anterior capsulorrhaphy.  Switching sticks were used to place the arthroscope through the anterior portal.  The patient had a diminutive posterior labrum with a patulous posterior capsule.  The decision was made to perform a posterior capsulorrhaphy.  Two 7 mm cannulas were placed posteriorly.  Arthroscopic rasp was used to debrade the posterior capsule.  An Arthrex biosuture tak anchors was then placed in the posterior glenoid.  A 90 degree  suture lasso was used to pass a single suture limb of this anchor through the capsule and under the posterior labrum.  An arthroscopic knot tying technique was used to approximate the first pass suture tack anchor at approximately the 5 o'clock position.  A second bio suture tack anchor was then placed at the 3 o'clock position following the same technique as noted above.  The capsulorrhaphy was then probed for stability.  Final arthroscopic images of the posterior capsulorrhaphy were taken.  Switching sticks were again used to place the arthroscope through the posterior portal.  The decision was made to reinforce the anterior capsulorrhaphy.  The two 7 mm cannulas were then placed through the anterior and an anterior lateral incision which was made under direct visualization using a spinal needle.  An bio suture tack anchor was initially placed at approximately the 6 o'clock position but unloaded and not used for repair as adequate capsule could not be reduced to this anchor.  Anchors at the 8:00 and 10 o'clock position were placed straddling the previous anchors placed at 5:00 and 3:00.  Again a single limb of each of these anchors was passed through the anterior capsule and under the anterior labrum using a 90 degree suture lasso.  An arthroscopic knot tying technique was used to complete the capsulorrhaphy.  Final arthroscopic images were taken.  The humeral head was well centered within the glenoid.  There was a significant soft tissue bumper now circumferentially around the glenoid.The glenohumeral joint was then copiously irrigated.  All arthroscopic instruments were removed.   The arthroscope was then placed through the posterior portal into the subacromial space to evaluate for subacromial bursitis.  No significant bursitis was encountered and the arthroscope was removed.  No debridement or subacromial decompression was performed.  The five arthroscopic portals were closed with 4-0 nylon suture. A  dry, sterile dressing was applied to the left shoulder, along with a Polar Care sleeve. Patient's left arm was then placed in an abduction sling. She was awoken and brought to PACU in stable condition. I was scrubbed and present for the entire case and all sharp and instrument counts were correct at the conclusion the case. I spoke with the patient's mother in the postop consultation room to let her know what was done during the operation and to let her know her daughter was stable in the recovery room.  I reviewed in detail the postoperative instructions with her.  I answered all her questions.    Timoteo Gaul, MD

## 2020-07-31 NOTE — H&P (Signed)
PREOPERATIVE H&P  Chief Complaint: SLAP tear  HPI: Wendy Cherry is a 20 y.o. female who presents for preoperative history and physical with recurrent left shoulder pain following SLAP repair and anterior capsulorrhaphy on.08/22/20.  Symptoms of pain with overhead motion and lifting left arm are significantly impairing activities of daily living.  An MRI questioned whether there may be a retear of her anterior labrum/capsulorrhaphy.  Patient has failed nonoperative management and wished to proceed with arthroscopic evaluation of the left shoulder with possible revision capsulorrhaphy versus SLAP repair versus biceps tenodesis.   Past Medical History:  Diagnosis Date  . Anxiety    Past Surgical History:  Procedure Laterality Date  . SHOULDER ARTHROSCOPY WITH LABRAL REPAIR Left 08/23/2019   Procedure: LEFT SHOULDER ARTHROSCOPY WITH LABRAL REPAIR;  Surgeon: Juanell Fairly, MD;  Location: [facility] ORS;  Service: Orthopedics;  Laterality: Left;   Social History   Socioeconomic History  . Marital status: Single    Spouse name: Not on file  . Number of children: Not on file  . Years of education: Not on file  . Highest education level: Not on file  Occupational History  . Not on file  Tobacco Use  . Smoking status: Never Smoker  . Smokeless tobacco: Never Used  Vaping Use  . Vaping Use: Never used  Substance and Sexual Activity  . Alcohol use: Never  . Drug use: Never  . Sexual activity: Never  Other Topics Concern  . Not on file  Social History Narrative  . Not on file   Social Determinants of Health   Financial Resource Strain: Not on file  Food Insecurity: Not on file  Transportation Needs: Not on file  Physical Activity: Not on file  Stress: Not on file  Social Connections: Not on file   History reviewed. No pertinent family history. No Known Allergies Prior to Admission medications   Medication Sig Start Date End Date Taking? Authorizing Provider  clonazePAM (KLONOPIN)  0.5 MG tablet Take 0.25 mg by mouth 2 (two) times daily.   Yes [provider]  escitalopram (LEXAPRO) 20 MG tablet Take 1 tablet (20 mg total) by mouth at bedtime. 07/10/19  Yes Chauncey Mann, MD  gabapentin (NEURONTIN) 100 MG capsule Take 200 mg by mouth 2 (two) times daily. 06/26/20  Yes [provider]  ibuprofen (ADVIL) 200 MG tablet Take 600 mg by mouth every 6 (six) hours as needed for headache or moderate pain.   Yes [provider]  levonorgestrel-ethinyl estradiol (SEASONALE) 0.15-0.03 MG tablet Take 1 tablet by mouth daily. 07/14/20  Yes [provider]     Positive ROS: All other systems have been reviewed and were otherwise negative with the exception of those mentioned in the HPI and as above.  Physical Exam: General: Alert, no acute distress Cardiovascular: Regular rate and rhythm, no murmurs rubs or gallops.  No pedal edema Respiratory: Clear to auscultation bilaterally, no wheezes rales or rhonchi. No cyanosis, no use of accessory musculature GI: No organomegaly, abdomen is soft and non-tender nondistended with positive bowel sounds. Skin: Skin intact, no lesions within the operative field. Neurologic: Sensation intact distally Psychiatric: Patient is competent for consent with normal mood and affect Lymphatic: No cervical lymphadenopathy  MUSCULOSKELETAL: Patient has forward elevation and abduction to approximate 150 to 160 degrees in the office prior to the date of surgery.  She demonstrated no sulcus sign or instability to load-and-shift testing.  Patient tenderness of the biceps tendon and a positive speeds test.  She had an equivocal O'Brien's test.  She has mild pain with impingement testing but no apprehension or instability.  She had full digital wrist and elbow range of motion, intact sensation light touch and a palpable radial pulse.  Assessment: Right shoulder pain with possible recurrent anterior labral tear versus recurrent SLAP  tear versus biceps diagnosis.  Plan: Plan for Procedure(s): LEFT SHOULDER ARTHROSCOPIC EVALUATION WITH POSSSIBLE WITH REVISION LABRAL REPAIR AND POSSIBLE BICEPS TENODESIS  I reviewed the details of the operation as well as the postoperative course.  A preop history and physical was performed at the bedside.  I have reviewed the patient's MR arthrogram in preparation for this case.  Patient is aware of the risks and benefits as well as the postoperative plan having been through this case before.  I discussed the risks and benefits of surgery. The risks include but are not limited to infection, bleeding , nerve or blood vessel injury, joint stiffness or loss of motion, persistent pain, weakness or instability, retear of the labrum, failure of the biceps tenodesis, Popeye deformity of the biceps and hardware failure and the need for further surgery. Medical risks include but are not limited to DVT and pulmonary embolism, myocardial infarction, stroke, pneumonia, respiratory failure and death. Patient understood these risks and wished to proceed.     Juanell Fairly, MD   07/31/2020 2:14 PM

## 2020-08-01 NOTE — Anesthesia Postprocedure Evaluation (Signed)
Anesthesia Post Note  Patient: Wendy Cherry  Procedure(s) Performed: LEFT SHOULDER ARTHROSCOPY WITH REVISION LABRAL REPAIR AND POSSIBLE BICEPS TENODESIS (Left Shoulder)  Patient location during evaluation: PACU Anesthesia Type: General Level of consciousness: awake and alert Pain management: pain level controlled Vital Signs Assessment: post-procedure vital signs reviewed and stable Respiratory status: spontaneous breathing, nonlabored ventilation, respiratory function stable and patient connected to nasal cannula oxygen Cardiovascular status: blood pressure returned to baseline and stable Postop Assessment: no apparent nausea or vomiting Anesthetic complications: no   No complications documented.   Last Vitals:  Vitals:   07/31/20 1715 07/31/20 1733  BP: 116/83 116/75  Pulse: 70 70  Resp: 17 16  Temp: 36.6 C 36.7 C  SpO2: 98% 98%    Last Pain:  Vitals:   07/31/20 1733  TempSrc: Temporal  PainSc: 0-No pain                 Lenard Simmer

## 2020-08-08 ENCOUNTER — Encounter: Payer: Self-pay | Admitting: Orthopedic Surgery

## 2020-10-31 ENCOUNTER — Other Ambulatory Visit: Payer: Self-pay | Admitting: Physician Assistant

## 2020-10-31 ENCOUNTER — Other Ambulatory Visit (HOSPITAL_COMMUNITY): Payer: Self-pay | Admitting: Physician Assistant

## 2020-10-31 DIAGNOSIS — M542 Cervicalgia: Secondary | ICD-10-CM

## 2020-10-31 DIAGNOSIS — R519 Headache, unspecified: Secondary | ICD-10-CM

## 2020-10-31 DIAGNOSIS — M5481 Occipital neuralgia: Secondary | ICD-10-CM

## 2020-11-06 ENCOUNTER — Ambulatory Visit
Admission: RE | Admit: 2020-11-06 | Discharge: 2020-11-06 | Disposition: A | Discharge status: Home / Self Care | Payer: Managed Care, Other (non HMO) | Source: Ambulatory Visit | Attending: Physician Assistant | Admitting: Physician Assistant

## 2020-11-06 ENCOUNTER — Other Ambulatory Visit: Payer: Self-pay

## 2020-11-06 DIAGNOSIS — R519 Headache, unspecified: Secondary | ICD-10-CM | POA: Diagnosis not present

## 2020-11-06 DIAGNOSIS — M5481 Occipital neuralgia: Secondary | ICD-10-CM | POA: Insufficient documentation

## 2020-11-06 DIAGNOSIS — M542 Cervicalgia: Secondary | ICD-10-CM | POA: Diagnosis present

## 2021-02-02 ENCOUNTER — Telehealth: Payer: Self-pay | Admitting: Behavioral Health

## 2021-02-02 ENCOUNTER — Other Ambulatory Visit: Payer: Self-pay | Admitting: Behavioral Health

## 2021-02-02 DIAGNOSIS — F3281 Premenstrual dysphoric disorder: Secondary | ICD-10-CM

## 2021-02-02 DIAGNOSIS — F410 Panic disorder [episodic paroxysmal anxiety] without agoraphobia: Secondary | ICD-10-CM

## 2021-02-02 DIAGNOSIS — F411 Generalized anxiety disorder: Secondary | ICD-10-CM

## 2021-02-02 MED ORDER — ESCITALOPRAM OXALATE 20 MG PO TABS: 20.0000 mg | ORAL_TABLET | Freq: Every day | ORAL | 0 refills | Status: DC

## 2021-02-02 NOTE — Telephone Encounter (Signed)
LVM to rtc 

## 2021-02-02 NOTE — Telephone Encounter (Signed)
Pt called and said that she needs a refill on her lexapro. She is a past pt of jennings and has an appt with brian on December 6th. Pharmacy is [facility] located at [address] [place]  [id number]

## 2021-02-02 NOTE — Telephone Encounter (Signed)
I wrote her 10 tablets to get by.  She has not seen Dr. Marlyne Beards since May, 2021. Please ask her how much has she been taking and how has she been getting scripts. She did not have enough from Dr. Marlyne Beards to last this long. Please advise her.

## 2021-02-02 NOTE — Telephone Encounter (Signed)
Please review

## 2021-02-09 ENCOUNTER — Other Ambulatory Visit: Payer: Self-pay | Admitting: Behavioral Health

## 2021-02-09 DIAGNOSIS — F411 Generalized anxiety disorder: Secondary | ICD-10-CM

## 2021-02-09 DIAGNOSIS — F3281 Premenstrual dysphoric disorder: Secondary | ICD-10-CM

## 2021-02-09 DIAGNOSIS — F410 Panic disorder [episodic paroxysmal anxiety] without agoraphobia: Secondary | ICD-10-CM

## 2021-02-10 ENCOUNTER — Encounter: Payer: Self-pay | Admitting: Behavioral Health

## 2021-02-10 ENCOUNTER — Ambulatory Visit: Payer: 59 | Admitting: Behavioral Health

## 2021-02-10 ENCOUNTER — Other Ambulatory Visit: Payer: Self-pay

## 2021-02-10 DIAGNOSIS — F902 Attention-deficit hyperactivity disorder, combined type: Secondary | ICD-10-CM | POA: Diagnosis not present

## 2021-02-10 DIAGNOSIS — F411 Generalized anxiety disorder: Secondary | ICD-10-CM | POA: Diagnosis not present

## 2021-02-10 DIAGNOSIS — F410 Panic disorder [episodic paroxysmal anxiety] without agoraphobia: Secondary | ICD-10-CM

## 2021-02-10 MED ORDER — ESCITALOPRAM OXALATE 20 MG PO TABS: 20.0000 mg | ORAL_TABLET | Freq: Every day | ORAL | 1 refills | Status: DC

## 2021-02-10 NOTE — Progress Notes (Signed)
[facility]  Patient ID: Wendy Cherry,  MRN: [id number]  PCP: Nira Retort  Date of Evaluation: 02/10/2021 Time spent:30 minutes  Chief Complaint:  Chief Complaint   Anxiety; Depression; Follow-up; Medication Refill     HISTORY/CURRENT STATUS: HPI  20 year old present to this office for follow up and medication refill. She is former pt of Dr. Beverly Milch retired. She says that she has been on AD since the age of 50. She tried to come off Lexapro this past year but did not like the way she felt. She does not want to make any medication changes at this time and would like to continue the Lexapro. She is working part-time currently and going to school. She says her anxiety today is 3/10 and depression is 2/10. She is sleeping 7 plus hours per night. No mania, no psychosis, no SI/HI. She likes yearly follow up but she agrees to follow next visit in 3 months.   Past psychiatric medication trial: Wellbutrin    Individual Medical History/ Review of Systems: Changes? :No   Allergies: Patient has no known allergies.  Current Medications:  Current Outpatient Medications:    clonazePAM (KLONOPIN) 0.5 MG tablet, Take 0.25 mg by mouth 2 (two) times daily., Disp: , Rfl:    escitalopram (LEXAPRO) 20 MG tablet, Take 1 tablet (20 mg total) by mouth at bedtime., Disp: 90 tablet, Rfl: 1   gabapentin (NEURONTIN) 100 MG capsule, Take 200 mg by mouth 2 (two) times daily., Disp: , Rfl:    ibuprofen (ADVIL) 200 MG tablet, Take 600 mg by mouth every 6 (six) hours as needed for headache or moderate pain., Disp: , Rfl:    levonorgestrel-ethinyl estradiol (SEASONALE) 0.15-0.03 MG tablet, Take 1 tablet by mouth daily., Disp: , Rfl:    ondansetron (ZOFRAN) 4 MG tablet, Take 1 tablet (4 mg total) by mouth every 8 (eight) hours as needed for nausea or vomiting., Disp: 30 tablet, Rfl: 0   oxyCODONE (OXY IR/ROXICODONE) 5 MG immediate release tablet, Take 1 tablet (5 mg total) by mouth  every 4 (four) hours as needed., Disp: 40 tablet, Rfl: 0 Medication Side Effects: none  Family Medical/ Social History: Changes? No  MENTAL HEALTH EXAM:  There were no vitals taken for this visit.There is no height or weight on file to calculate BMI.  General Appearance: Casual, Neat, and Well Groomed  Eye Contact:  Good  Speech:  Clear and Coherent  Volume:  Normal  Mood:  NA  Affect:  Appropriate  Thought Process:  Coherent  Orientation:  Full (Time, Place, and Person)  Thought Content: Logical   Suicidal Thoughts:  No  Homicidal Thoughts:  No  Memory:  WNL  Judgement:  Good  Insight:  Good  Psychomotor Activity:  Normal  Concentration:  Concentration: Good  Recall:  Good  Fund of Knowledge: Good  Language: Good  Assets:  Desire for Improvement  ADL's:  Intact  Cognition: WNL  Prognosis:  Good    DIAGNOSES:    ICD-10-CM   1. Generalized anxiety disorder  F41.1 escitalopram (LEXAPRO) 20 MG tablet    2. 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 Panic disorder  F41.0 escitalopram (LEXAPRO) 20 MG tablet      Receiving Psychotherapy: No    RECOMMENDATIONS:  Greater than 50% of 30 min face to face time with patient was spent on counseling and coordination of care. We discussed hx of anxiety and depression stemming  back to 20 years of age. We discussed her past tx with Dr. Creig Hines. No medication changes indicated at this time. Will continue Lexapro 20 mg dialy To reports worsening symptoms To follow up in 3 months to reassess Provided emergency contact information Reviewed Goodman, NP

## 2021-02-26 ENCOUNTER — Ambulatory Visit
Admission: EM | Admit: 2021-02-26 | Discharge: 2021-02-26 | Disposition: A | Discharge status: Home / Self Care | Payer: Managed Care, Other (non HMO) | Attending: Physician Assistant | Admitting: Physician Assistant

## 2021-02-26 ENCOUNTER — Encounter: Payer: Self-pay | Admitting: Emergency Medicine

## 2021-02-26 ENCOUNTER — Other Ambulatory Visit: Payer: Self-pay

## 2021-02-26 DIAGNOSIS — R051 Acute cough: Secondary | ICD-10-CM | POA: Diagnosis not present

## 2021-02-26 DIAGNOSIS — J069 Acute upper respiratory infection, unspecified: Secondary | ICD-10-CM

## 2021-02-26 DIAGNOSIS — R0981 Nasal congestion: Secondary | ICD-10-CM | POA: Diagnosis not present

## 2021-02-26 MED ORDER — PROMETHAZINE-DM 6.25-15 MG/5ML PO SYRP: 5.0000 mL | ORAL_SOLUTION | Freq: Three times a day (TID) | ORAL | 0 refills | Status: DC | PRN

## 2021-02-26 MED ORDER — PREDNISONE 10 MG PO TABS: 20.0000 mg | ORAL_TABLET | Freq: Every day | ORAL | 0 refills | Status: AC

## 2021-02-26 NOTE — ED Provider Notes (Signed)
Renaldo Fiddler    CSN: [id number] Arrival date & time: 02/26/21  1301      History   Chief Complaint Chief Complaint  Patient presents with   Cough   Nasal Congestion   Sore Throat    HPI Wendy Cherry is a 20 y.o. female.   Patient presents today with a 3-day history of URI symptoms.  Reports cough, rhinorrhea, sore throat, nausea, vomiting, diarrhea.  She denies any fever, chest pain, shortness of breath, abdominal pain.  She has tried Mucinex and over-the-counter medication without improvement of symptoms.  Denies any known sick contacts.  She is up-to-date on immunizations but has not had COVID or flu vaccine.  She has had COVID in August 2021.  She denies any history of asthma or allergies.  She does vape but denies tobacco exposure.  Denies any recent antibiotics.  She is having difficulty with daily activities as symptoms have worsened prompting evaluation today.  She denies any history of diabetes; reports she was prediabetic several years ago but more recent blood sugars have been normal.   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      Home  Medications    Prior to Admission medications   Medication Sig Start Date End Date Taking? Authorizing Provider  clonazePAM (KLONOPIN) 0.5 MG tablet Take 0.25 mg by mouth 2 (two) times daily.   Yes [provider]  escitalopram (LEXAPRO) 20 MG tablet Take 1 tablet (20 mg total) by mouth at bedtime. 02/10/21  Yes Avelina Laine A, NP  gabapentin (NEURONTIN) 100 MG capsule Take 200 mg by mouth 2 (two) times daily. 06/26/20  Yes [provider]  levonorgestrel-ethinyl estradiol (SEASONALE) 0.15-0.03 MG tablet Take 1 tablet by mouth daily. 07/14/20  Yes [provider]  predniSONE (DELTASONE) 10 MG tablet Take 2 tablets (20 mg total) by mouth daily for 4 days. 02/26/21 03/02/21 Yes Deverick Pruss K, PA-C  promethazine-dextromethorphan (PROMETHAZINE-DM) 6.25-15 MG/5ML syrup Take 5 mLs by mouth 3 (three) times daily as needed for cough. 02/26/21  Yes Kayen Grabel K, PA-C  ibuprofen (ADVIL) 200 MG tablet Take 600 mg by mouth every 6 (six) hours as needed for headache or moderate pain.    [provider]  ondansetron (ZOFRAN) 4 MG tablet Take 1 tablet (4 mg total) by mouth every 8 (eight) hours as needed for nausea or vomiting. 07/31/20   Juanell Fairly, MD  oxyCODONE (OXY IR/ROXICODONE) 5 MG immediate release tablet Take 1 tablet (5 mg total) by mouth every 4 (four) hours as needed. 07/31/20   Juanell Fairly, MD    Family History History reviewed. No pertinent family history.  Social History Social History   Tobacco Use  Smoking status: Never   Smokeless tobacco: Never  Vaping Use   Vaping Use: Every day  Substance Use Topics   Alcohol use: Never   Drug use: Never     Allergies   Patient has no known allergies.   Review of Systems Review of Systems  Constitutional:  Positive for activity change. Negative for appetite change, fatigue and fever.  HENT:  Positive for congestion, postnasal drip, sinus pressure and sore throat. Negative for sneezing.    Respiratory:  Positive for cough. Negative for shortness of breath.   Cardiovascular:  Negative for chest pain.  Gastrointestinal:  Positive for diarrhea, nausea and vomiting. Negative for abdominal pain.  Musculoskeletal:  Negative for arthralgias and myalgias.  Neurological:  Positive for headaches. Negative for dizziness and light-headedness.    Physical Exam Triage Vital Signs ED Triage Vitals  Enc Vitals Group     BP 02/26/21 1334 102/71     Pulse Rate 02/26/21 1334 85     Resp 02/26/21 1334 16     Temp 02/26/21 1334 98.9 F (37.2 C)     Temp Source 02/26/21 1334 Oral     SpO2 02/26/21 1334 96 %     Weight --      Height --      Head Circumference --      Peak Flow --      Pain Score 02/26/21 1333 0     Pain Loc --      Pain Edu? --      Excl. in GC? --    No data found.  Updated Vital Signs BP 102/71 (BP Location: Right Arm)    Pulse 85    Temp 98.9 F (37.2 C) (Oral)    Resp 16    SpO2 96%   Visual Acuity Right Eye Distance:   Left Eye Distance:   Bilateral Distance:    Right Eye Near:   Left Eye Near:    Bilateral Near:     Physical Exam Vitals reviewed.  Constitutional:      General: She is awake. She is not in acute distress.    Appearance: Normal appearance. She is well-developed. She is not ill-appearing.     Comments: Very pleasant female appears stated age in no acute distress sitting comfortably in exam room  HENT:     Head: Normocephalic and atraumatic.     Right Ear: Tympanic membrane, ear canal and external ear normal. Tympanic membrane is not erythematous or bulging.     Left Ear: Tympanic membrane, ear canal and external ear normal. Tympanic membrane is not erythematous or bulging.     Nose: Congestion present.     Right Sinus: No maxillary sinus tenderness or frontal sinus tenderness.     Left Sinus: No maxillary sinus tenderness or frontal sinus tenderness.     Mouth/Throat:     Pharynx: Uvula midline. Posterior oropharyngeal erythema  present. No oropharyngeal exudate.     Comments: Erythema and drainage in posterior oropharynx Cardiovascular:     Rate and Rhythm: Normal rate and regular rhythm.     Heart sounds: Normal heart sounds, S1 normal and S2 normal. No murmur heard. Pulmonary:     Effort: Pulmonary effort is normal.     Breath sounds: Normal breath sounds. No wheezing, rhonchi or rales.     Comments: Clear to auscultation bilaterally Psychiatric:        Behavior: Behavior is cooperative.     UC Treatments / Results  Labs (all  labs ordered are listed, but only abnormal results are displayed) Labs Reviewed  COVID-19, FLU A+B NAA    EKG   Radiology No results found.  Procedures Procedures (including critical care time)  Medications Ordered in UC Medications - No data to display  Initial Impression / Assessment and Plan / UC Course  I have reviewed the triage vital signs and the nursing notes.  Pertinent labs & imaging results that were available during my care of the patient were reviewed by me and considered in my medical decision making (see chart for details).     Discussed likely viral etiology given short duration of symptoms.  No evidence of acute infection on physical exam that would warrant initiation of antibiotics.  Patient is outside the window of effectiveness for Tamiflu so viral testing was sent out; COVID and flu testing was obtained and we will contact her if anything is positive.  Recommended she use over-the-counter medication including Tylenol, Mucinex, Flonase for symptom relief.  She was prescribed Promethazine DM for cough with instruction not to drive or drink alcohol while taking this medication as drowsiness is a common side effect.  She was given prednisone burst (20 mg x 4 days) with instruction not to take NSAIDs including aspirin, ibuprofen/Advil, naproxen/Aleve due to risk of GI bleeding.  She is to rest and drink plenty of fluid.  Discussed that if she has any worsening  symptoms including high fever not responding to medication, chest pain, shortness of breath, nausea/vomiting interfering with oral intake she needs to be reevaluated immediately.  Strict return precautions given to which she expressed understanding.  She was provided work excuse note.  Final Clinical Impressions(s) / UC Diagnoses   Final diagnoses:  Upper respiratory tract infection, unspecified type  Acute cough  Nasal congestion     Discharge Instructions      I believe that you have a virus.  We will contact you if your COVID or flu testing is positive.  Please remain in isolation until you receive these results.  Use Promethazine DM up to 3 times a day as needed for cough.  This will make you sleepy so do not drive or drink alcohol while taking it.  Use prednisone burst for additional symptom relief.  Do not take NSAIDs including aspirin, ibuprofen/Advil, naproxen/Aleve with this medication as it can cause stomach bleeding.  You can use Tylenol, Mucinex, Flonase for symptom relief.  If you have any worsening symptoms including high fever, chest pain, shortness of breath, nausea/vomiting interfering with oral intake, weakness you need to go to the emergency room.  If your symptoms or not improving within a week please follow-up with either our clinic or your primary care provider.     ED Prescriptions     Medication Sig Dispense Auth. Provider   promethazine-dextromethorphan (PROMETHAZINE-DM) 6.25-15 MG/5ML syrup Take 5 mLs by mouth 3 (three) times daily as needed for cough. 118 mL Linetta Regner K, PA-C   predniSONE (DELTASONE) 10 MG tablet Take 2 tablets (20 mg total) by mouth daily for 4 days. 8 tablet Demeco Ducksworth K, PA-C      I have reviewed the PDMP during this encounter.   Jeani Hawking, PA-C 02/26/21 1349

## 2021-02-26 NOTE — Discharge Instructions (Signed)
I believe that you have a virus.  We will contact you if your COVID or flu testing is positive.  Please remain in isolation until you receive these results.  Use Promethazine DM up to 3 times a day as needed for cough.  This will make you sleepy so do not drive or drink alcohol while taking it.  Use prednisone burst for additional symptom relief.  Do not take NSAIDs including aspirin, ibuprofen/Advil, naproxen/Aleve with this medication as it can cause stomach bleeding.  You can use Tylenol, Mucinex, Flonase for symptom relief.  If you have any worsening symptoms including high fever, chest pain, shortness of breath, nausea/vomiting interfering with oral intake, weakness you need to go to the emergency room.  If your symptoms or not improving within a week please follow-up with either our clinic or your primary care provider.

## 2021-02-26 NOTE — ED Triage Notes (Signed)
Pt c/o cough, runny nose, ST, and upset stomach x 3 days .

## 2021-02-27 LAB — COVID-19, FLU A+B NAA
Influenza A, NAA: NOT DETECTED
Influenza B, NAA: NOT DETECTED
SARS-CoV-2, NAA: NOT DETECTED

## 2021-03-18 DIAGNOSIS — M25512 Pain in left shoulder: Secondary | ICD-10-CM | POA: Diagnosis not present

## 2021-03-18 DIAGNOSIS — M25312 Other instability, left shoulder: Secondary | ICD-10-CM | POA: Diagnosis not present

## 2021-03-20 DIAGNOSIS — S46912A Strain of unspecified muscle, fascia and tendon at shoulder and upper arm level, left arm, initial encounter: Secondary | ICD-10-CM | POA: Diagnosis not present

## 2021-04-01 DIAGNOSIS — F331 Major depressive disorder, recurrent, moderate: Secondary | ICD-10-CM | POA: Diagnosis not present

## 2021-05-05 DIAGNOSIS — M9902 Segmental and somatic dysfunction of thoracic region: Secondary | ICD-10-CM | POA: Diagnosis not present

## 2021-05-05 DIAGNOSIS — M542 Cervicalgia: Secondary | ICD-10-CM | POA: Diagnosis not present

## 2021-05-05 DIAGNOSIS — M9901 Segmental and somatic dysfunction of cervical region: Secondary | ICD-10-CM | POA: Diagnosis not present

## 2021-05-05 DIAGNOSIS — M546 Pain in thoracic spine: Secondary | ICD-10-CM | POA: Diagnosis not present

## 2021-05-11 ENCOUNTER — Ambulatory Visit: Payer: BC Managed Care – PPO | Admitting: Behavioral Health

## 2021-05-11 ENCOUNTER — Other Ambulatory Visit: Payer: Self-pay

## 2021-05-11 ENCOUNTER — Encounter: Payer: Self-pay | Admitting: Behavioral Health

## 2021-05-11 DIAGNOSIS — F410 Panic disorder [episodic paroxysmal anxiety] without agoraphobia: Secondary | ICD-10-CM | POA: Diagnosis not present

## 2021-05-11 DIAGNOSIS — F330 Major depressive disorder, recurrent, mild: Secondary | ICD-10-CM

## 2021-05-11 DIAGNOSIS — F411 Generalized anxiety disorder: Secondary | ICD-10-CM

## 2021-05-11 DIAGNOSIS — F902 Attention-deficit hyperactivity disorder, combined type: Secondary | ICD-10-CM

## 2021-05-11 MED ORDER — BUPROPION HCL ER (XL) 150 MG PO TB24: 150.0000 mg | ORAL_TABLET | Freq: Every day | ORAL | 1 refills | Status: DC

## 2021-05-11 MED ORDER — ESCITALOPRAM OXALATE 20 MG PO TABS: 20.0000 mg | ORAL_TABLET | Freq: Every day | ORAL | 1 refills | Status: DC

## 2021-05-11 NOTE — Progress Notes (Signed)
[facility] ? ?Patient ID: Wendy Cherry,  ?MRN: [id number] ? ?PCP: Nira Retort ? ?Date of Evaluation: 05/11/2021 ?Time spent:30 minutes ? ?Chief Complaint:  ?Chief Complaint   ?Depression; Anxiety; ADHD; Follow-up; Medication Problem; Medication Refill ?  ? ? ?HISTORY/CURRENT STATUS: ?HPI ?21 year old present to this office for follow up and medication refill. She is former pt of Dr. Beverly Milch retired. Says she feel stable over all but her depression has been "like a roller coaster lately". Says that it comes and goes weekly. She feels like Lexapro has worked well for her but she still is having breakthrough anxiety and depression. She would like to try a adjunctive medication to help. Says that her neurologist placed her on trial of Effexor 75 mg daily but that it does not help with anxiety and stress. She says that it has effected her sleep. She would like to wean off the Effexor at this time.  She says her anxiety today is 4/10 and depression is 3/10. She is sleeping 7 plus hours per night. No mania, no psychosis, no SI/HI. She agrees to follow up in 4 weeks.  ?  ?Past psychiatric medication trial: ?Wellbutrin ?  ?Individual Medical History/ Review of Systems: Changes? :No  ? ?Allergies: Patient has no known allergies. ? ?Current Medications:  ?Current Outpatient Medications:  ?  buPROPion (WELLBUTRIN XL) 150 MG 24 hr tablet, Take 1 tablet (150 mg total) by mouth daily., Disp: 30 tablet, Rfl: 1 ?  clonazePAM (KLONOPIN) 0.5 MG tablet, Take 0.25 mg by mouth 2 (two) times daily. (Patient not taking: Reported on 05/11/2021), Disp: , Rfl:  ?  escitalopram (LEXAPRO) 20 MG tablet, Take 1 tablet (20 mg total) by mouth at bedtime., Disp: 90 tablet, Rfl: 1 ?  gabapentin (NEURONTIN) 100 MG capsule, Take 200 mg by mouth 2 (two) times daily., Disp: , Rfl:  ?  ibuprofen (ADVIL) 200 MG tablet, Take 600 mg by mouth every 6 (six) hours as needed for headache or moderate pain., Disp: , Rfl:  ?   levonorgestrel-ethinyl estradiol (SEASONALE) 0.15-0.03 MG tablet, Take 1 tablet by mouth daily., Disp: , Rfl:  ?  ondansetron (ZOFRAN) 4 MG tablet, Take 1 tablet (4 mg total) by mouth every 8 (eight) hours as needed for nausea or vomiting., Disp: 30 tablet, Rfl: 0 ?  oxyCODONE (OXY IR/ROXICODONE) 5 MG immediate release tablet, Take 1 tablet (5 mg total) by mouth every 4 (four) hours as needed., Disp: 40 tablet, Rfl: 0 ?  promethazine-dextromethorphan (PROMETHAZINE-DM) 6.25-15 MG/5ML syrup, Take 5 mLs by mouth 3 (three) times daily as needed for cough., Disp: 118 mL, Rfl: 0 ?Medication Side Effects: none ? ?Family Medical/ Social History: Changes? No ? ?MENTAL HEALTH EXAM: ? ?There were no vitals taken for this visit.There is no height or weight on file to calculate BMI.  ?General Appearance: Casual, Neat, and Well Groomed  ?Eye Contact:  Good  ?Speech:  Clear and Coherent  ?Volume:  Normal  ?Mood:  Anxious and Depressed  ?Affect:  Depressed and Anxious  ?Thought Process:  Coherent  ?Orientation:  Full (Time, Place, and Person)  ?Thought Content: Logical   ?Suicidal Thoughts:  No  ?Homicidal Thoughts:  No  ?Memory:  WNL  ?Judgement:  Good  ?Insight:  Good  ?Psychomotor Activity:  Normal  ?Concentration:  Concentration: Good  ?Recall:  Good  ?Fund of Knowledge: Good  ?Language: Good  ?Assets:  Desire for Improvement  ?ADL's:  Intact  ?Cognition: WNL  ?Prognosis:  Good  ? ? ?  DIAGNOSES:  ?  ICD-10-CM   ?1. Generalized anxiety disorder  F41.1 buPROPion (WELLBUTRIN XL) 150 MG 24 hr tablet  ?  escitalopram (LEXAPRO) 20 MG tablet  ?  ?2. Attention deficit hyperactivity disorder (ADHD), combined type, mild  F90.2 buPROPion (WELLBUTRIN XL) 150 MG 24 hr tablet  ?  ?3. Panic disorder  F41.0 escitalopram (LEXAPRO) 20 MG tablet  ?  ?4. Mild episode of recurrent major depressive disorder (HCC)  F33.0   ?  ? ? ?Receiving Psychotherapy: No  ? ? ?RECOMMENDATIONS:  ? ?Greater than 50% of 30 min face to face time with patient was spent  on counseling and coordination of care. We discussed her stability with anxiety and depression. She wants to focus more on her depression right now.  She agrees to a trial of Wellbutrin as adjunctive therapy.  ?Will continue Lexapro 20 mg daily ?To start Wellbutrin 150 mg XL daily  ?Will wean from Effexor 75 mg by decreasing by half each week until finished ?To reports worsening symptoms ?To follow up in 4 weeks to reassess ?Provided emergency contact information ?Reviewed PDMP ?  ? ? ?[name], NP  ?

## 2021-05-12 DIAGNOSIS — F331 Major depressive disorder, recurrent, moderate: Secondary | ICD-10-CM | POA: Diagnosis not present

## 2021-05-12 DIAGNOSIS — M25512 Pain in left shoulder: Secondary | ICD-10-CM | POA: Diagnosis not present

## 2021-05-12 DIAGNOSIS — M25612 Stiffness of left shoulder, not elsewhere classified: Secondary | ICD-10-CM | POA: Diagnosis not present

## 2021-05-18 DIAGNOSIS — M25512 Pain in left shoulder: Secondary | ICD-10-CM | POA: Diagnosis not present

## 2021-05-18 DIAGNOSIS — M25312 Other instability, left shoulder: Secondary | ICD-10-CM | POA: Diagnosis not present

## 2021-05-18 DIAGNOSIS — M25612 Stiffness of left shoulder, not elsewhere classified: Secondary | ICD-10-CM | POA: Diagnosis not present

## 2021-05-25 DIAGNOSIS — M25512 Pain in left shoulder: Secondary | ICD-10-CM | POA: Diagnosis not present

## 2021-05-25 DIAGNOSIS — M25612 Stiffness of left shoulder, not elsewhere classified: Secondary | ICD-10-CM | POA: Diagnosis not present

## 2021-05-25 DIAGNOSIS — M25312 Other instability, left shoulder: Secondary | ICD-10-CM | POA: Diagnosis not present

## 2021-05-26 DIAGNOSIS — F331 Major depressive disorder, recurrent, moderate: Secondary | ICD-10-CM | POA: Diagnosis not present

## 2021-06-05 DIAGNOSIS — S46912A Strain of unspecified muscle, fascia and tendon at shoulder and upper arm level, left arm, initial encounter: Secondary | ICD-10-CM | POA: Diagnosis not present

## 2021-06-08 ENCOUNTER — Encounter: Payer: Self-pay | Admitting: Behavioral Health

## 2021-06-08 ENCOUNTER — Telehealth (INDEPENDENT_AMBULATORY_CARE_PROVIDER_SITE_OTHER): Payer: BC Managed Care – PPO | Admitting: Behavioral Health

## 2021-06-08 DIAGNOSIS — F902 Attention-deficit hyperactivity disorder, combined type: Secondary | ICD-10-CM | POA: Diagnosis not present

## 2021-06-08 DIAGNOSIS — F330 Major depressive disorder, recurrent, mild: Secondary | ICD-10-CM | POA: Diagnosis not present

## 2021-06-08 DIAGNOSIS — F410 Panic disorder [episodic paroxysmal anxiety] without agoraphobia: Secondary | ICD-10-CM

## 2021-06-08 DIAGNOSIS — F411 Generalized anxiety disorder: Secondary | ICD-10-CM

## 2021-06-08 DIAGNOSIS — F3281 Premenstrual dysphoric disorder: Secondary | ICD-10-CM

## 2021-06-08 NOTE — Progress Notes (Signed)
Wendy Cherry ?[id number] ?2000-08-22 ?21 y.o. ? ?Virtual Visit via Video Note ? ?I connected with pt @ on 06/08/21 at  2:30 PM EDT by a video enabled telemedicine application and verified that I am speaking with the correct person using two identifiers. ?  ?I discussed the limitations of evaluation and management by telemedicine and the availability of in person appointments. The patient expressed understanding and agreed to proceed. ? ?I discussed the assessment and treatment plan with the patient. The patient was provided an opportunity to ask questions and all were answered. The patient agreed with the plan and demonstrated an understanding of the instructions. ?  ?The patient was advised to call back or seek an in-person evaluation if the symptoms worsen or if the condition fails to improve as anticipated. ? ?I provided 20 minutes of non-face-to-face time during this encounter.  The patient was located at home.  The provider was located at [facility]. ? ? ?Wendy Flores, NP  ? ?Subjective:  ? ?Patient ID:  Wendy Cherry is a 21 y.o. (DOB [birth year]) female. ? ?Chief Complaint:  ?Chief Complaint  ?Patient presents with  ? Anxiety  ? Depression  ? Follow-up  ? Medication Problem  ? Medication Refill  ? ADHD  ? ? ?HPI ?Wendy Cherry presents for follow-up and medication management.  She is former pt of Dr. Beverly Cherry retired. She says that since weaning off the Effexor and starting Wellbutrin she has felt much better. Says that it comes and goes weekly.  She says her anxiety today is 0/10 and depression is 0/10. She is sleeping 7 plus hours per night. No mania, no psychosis, no SI/HI. She agrees to follow up in 6 weeks.  ?  ?Past psychiatric medication trial: ?Wellbutrin ? ?Review of Systems:  ?Review of Systems  ?Constitutional: Negative.   ?Cardiovascular:  Negative for palpitations.  ?Allergic/Immunologic: Negative.   ?Neurological:  Negative for tremors and weakness.  ?Psychiatric/Behavioral:  Negative.    ? ?Medications: I have reviewed the patient's current medications. ? ?Current Outpatient Medications  ?Medication Sig Dispense Refill  ? buPROPion (WELLBUTRIN XL) 150 MG 24 hr tablet Take 1 tablet (150 mg total) by mouth daily. 30 tablet 1  ? clonazePAM (KLONOPIN) 0.5 MG tablet Take 0.25 mg by mouth 2 (two) times daily. (Patient not taking: Reported on 05/11/2021)    ? escitalopram (LEXAPRO) 20 MG tablet Take 1 tablet (20 mg total) by mouth at bedtime. 90 tablet 1  ? gabapentin (NEURONTIN) 100 MG capsule Take 200 mg by mouth 2 (two) times daily.    ? ibuprofen (ADVIL) 200 MG tablet Take 600 mg by mouth every 6 (six) hours as needed for headache or moderate pain.    ? levonorgestrel-ethinyl estradiol (SEASONALE) 0.15-0.03 MG tablet Take 1 tablet by mouth daily.    ? ondansetron (ZOFRAN) 4 MG tablet Take 1 tablet (4 mg total) by mouth every 8 (eight) hours as needed for nausea or vomiting. 30 tablet 0  ? oxyCODONE (OXY IR/ROXICODONE) 5 MG immediate release tablet Take 1 tablet (5 mg total) by mouth every 4 (four) hours as needed. 40 tablet 0  ? promethazine-dextromethorphan (PROMETHAZINE-DM) 6.25-15 MG/5ML syrup Take 5 mLs by mouth 3 (three) times daily as needed for cough. 118 mL 0  ? ?No current facility-administered medications for this visit.  ? ? ?Medication Side Effects: None ? ?Allergies: No Known Allergies ? ?Past Medical History:  ?Diagnosis Date  ? Anxiety   ? ? ?No family history  on file. ? ?Social History  ? ?Socioeconomic History  ? Marital status: Single  ?  Spouse name: Not on file  ? Number of children: Not on file  ? Years of education: Not on file  ? Highest education level: Not on file  ?Occupational History  ? Not on file  ?Tobacco Use  ? Smoking status: Never  ? Smokeless tobacco: Never  ?Vaping Use  ? Vaping Use: Every day  ?Substance and Sexual Activity  ? Alcohol use: Never  ? Drug use: Never  ? Sexual activity: Never  ?Other Topics Concern  ? Not on file  ?Social History Narrative   ? Not on file  ? ?Social Determinants of Health  ? ?Financial Resource Strain: Not on file  ?Food Insecurity: Not on file  ?Transportation Needs: Not on file  ?Physical Activity: Not on file  ?Stress: Not on file  ?Social Connections: Not on file  ?Intimate Partner Violence: Not on file  ? ? ?Past Medical History, Surgical history, Social history, and Family history were reviewed and updated as appropriate.  ? ?Please see review of systems for further details on the patient's review from today.  ? ?Objective:  ? ?Physical Exam:  ?There were no vitals taken for this visit. ? ?Physical Exam ?Neurological:  ?   Mental Status: She is alert and oriented to person, place, and time.  ?Psychiatric:     ?   Attention and Perception: Attention and perception normal.     ?   Mood and Affect: Mood normal.     ?   Speech: Speech normal.     ?   Behavior: Behavior normal. Behavior is cooperative.     ?   Cognition and Memory: Cognition and memory normal.     ?   Judgment: Judgment normal.  ?   Comments: Insight intact  ? ? ?Lab Review:  ?   ?Component Value Date/Time  ? NA 140 08/23/2019 1047  ? K 3.9 08/23/2019 1047  ? CL 109 08/23/2019 1047  ? CO2 24 08/23/2019 1047  ? GLUCOSE 87 08/23/2019 1047  ? BUN 10 08/23/2019 1047  ? CREATININE 0.85 08/23/2019 1047  ? CALCIUM 8.8 (L) 08/23/2019 1047  ? GFRNONAA >60 08/23/2019 1047  ? GFRAA >60 08/23/2019 1047  ? ? ?   ?Component Value Date/Time  ? WBC 6.2 08/23/2019 1047  ? RBC 4.60 08/23/2019 1047  ? HGB 13.1 08/23/2019 1047  ? HCT 38.2 08/23/2019 1047  ? PLT 221 08/23/2019 1047  ? MCV 83.0 08/23/2019 1047  ? MCH 28.5 08/23/2019 1047  ? MCHC 34.3 08/23/2019 1047  ? RDW 12.4 08/23/2019 1047  ? LYMPHSABS 2.4 08/23/2019 1047  ? MONOABS 0.4 08/23/2019 1047  ? EOSABS 0.1 08/23/2019 1047  ? BASOSABS 0.0 08/23/2019 1047  ? ? ?No results found for: POCLITH, LITHIUM  ? ?No results found for: PHENYTOIN, PHENOBARB, VALPROATE, CBMZ  ? ?.res ?Assessment: Plan:   ? ?Wendy Cherry was seen today for anxiety,  depression, follow-up, medication problem, medication refill and adhd. ? ?Diagnoses and all orders for this visit: ? ?Generalized anxiety disorder ? ?Attention deficit hyperactivity disorder (ADHD), combined type, mild ? ?Mild episode of recurrent major depressive disorder (HCC) ? ?Premenstrual dysphoric disorder ? ?Panic disorder ? ? Greater than 50% of 20 min face to face time with patient was spent on counseling and coordination of care. We discussed her stability with anxiety and depression. She wants to focus more on her depression right now.  Wellbutrin is  working well and Pt does not want to change medications at this time. ?Will continue Lexapro 20 mg daily ?Continue Wellbutrin 150 mg XL daily  ?To reports worsening symptoms ?To follow up in 6 weeks to reassess ?Provided emergency contact information ?Reviewed PDMP ? ?Please see After Visit Summary for patient specific instructions. ? ?No future appointments. ? ?No orders of the defined types were placed in this encounter. ? ? ?  ?-------------------------------  ?

## 2021-06-09 DIAGNOSIS — M25612 Stiffness of left shoulder, not elsewhere classified: Secondary | ICD-10-CM | POA: Diagnosis not present

## 2021-06-09 DIAGNOSIS — M25512 Pain in left shoulder: Secondary | ICD-10-CM | POA: Diagnosis not present

## 2021-06-09 DIAGNOSIS — M25312 Other instability, left shoulder: Secondary | ICD-10-CM | POA: Diagnosis not present

## 2021-07-10 DIAGNOSIS — J309 Allergic rhinitis, unspecified: Secondary | ICD-10-CM | POA: Diagnosis not present

## 2021-07-10 DIAGNOSIS — T7840XA Allergy, unspecified, initial encounter: Secondary | ICD-10-CM | POA: Diagnosis not present

## 2021-07-21 DIAGNOSIS — F331 Major depressive disorder, recurrent, moderate: Secondary | ICD-10-CM | POA: Diagnosis not present

## 2021-08-05 ENCOUNTER — Other Ambulatory Visit: Payer: Self-pay | Admitting: Behavioral Health

## 2021-08-05 DIAGNOSIS — F410 Panic disorder [episodic paroxysmal anxiety] without agoraphobia: Secondary | ICD-10-CM

## 2021-08-05 DIAGNOSIS — F411 Generalized anxiety disorder: Secondary | ICD-10-CM

## 2021-08-05 NOTE — Telephone Encounter (Signed)
Please schedule appt

## 2021-08-05 NOTE — Telephone Encounter (Signed)
LVM for pt to call back and get scheduled.

## 2021-10-27 DIAGNOSIS — S43006A Unspecified dislocation of unspecified shoulder joint, initial encounter: Secondary | ICD-10-CM | POA: Insufficient documentation

## 2021-10-27 DIAGNOSIS — S43005A Unspecified dislocation of left shoulder joint, initial encounter: Secondary | ICD-10-CM | POA: Diagnosis not present

## 2021-10-27 DIAGNOSIS — M25512 Pain in left shoulder: Secondary | ICD-10-CM | POA: Diagnosis not present

## 2021-11-02 DIAGNOSIS — M24412 Recurrent dislocation, left shoulder: Secondary | ICD-10-CM | POA: Diagnosis not present

## 2021-11-02 DIAGNOSIS — S43005A Unspecified dislocation of left shoulder joint, initial encounter: Secondary | ICD-10-CM | POA: Diagnosis not present

## 2021-11-02 DIAGNOSIS — M25512 Pain in left shoulder: Secondary | ICD-10-CM | POA: Diagnosis not present

## 2021-11-17 DIAGNOSIS — M24412 Recurrent dislocation, left shoulder: Secondary | ICD-10-CM | POA: Diagnosis not present

## 2021-11-30 DIAGNOSIS — M24412 Recurrent dislocation, left shoulder: Secondary | ICD-10-CM | POA: Diagnosis not present

## 2021-12-24 DIAGNOSIS — G8918 Other acute postprocedural pain: Secondary | ICD-10-CM | POA: Diagnosis not present

## 2021-12-24 DIAGNOSIS — M7502 Adhesive capsulitis of left shoulder: Secondary | ICD-10-CM | POA: Diagnosis not present

## 2021-12-24 DIAGNOSIS — M7522 Bicipital tendinitis, left shoulder: Secondary | ICD-10-CM | POA: Diagnosis not present

## 2021-12-24 DIAGNOSIS — M25572 Pain in left ankle and joints of left foot: Secondary | ICD-10-CM | POA: Diagnosis not present

## 2021-12-24 DIAGNOSIS — M7542 Impingement syndrome of left shoulder: Secondary | ICD-10-CM | POA: Diagnosis not present

## 2021-12-24 DIAGNOSIS — M25312 Other instability, left shoulder: Secondary | ICD-10-CM | POA: Diagnosis not present

## 2021-12-24 DIAGNOSIS — M67812 Other specified disorders of synovium, left shoulder: Secondary | ICD-10-CM | POA: Diagnosis not present

## 2021-12-24 DIAGNOSIS — M7552 Bursitis of left shoulder: Secondary | ICD-10-CM | POA: Diagnosis not present

## 2021-12-24 DIAGNOSIS — M65812 Other synovitis and tenosynovitis, left shoulder: Secondary | ICD-10-CM | POA: Diagnosis not present

## 2022-01-07 DIAGNOSIS — M25512 Pain in left shoulder: Secondary | ICD-10-CM | POA: Diagnosis not present

## 2022-01-07 DIAGNOSIS — M25612 Stiffness of left shoulder, not elsewhere classified: Secondary | ICD-10-CM | POA: Diagnosis not present

## 2022-01-12 DIAGNOSIS — M25512 Pain in left shoulder: Secondary | ICD-10-CM | POA: Diagnosis not present

## 2022-01-12 DIAGNOSIS — M25612 Stiffness of left shoulder, not elsewhere classified: Secondary | ICD-10-CM | POA: Diagnosis not present

## 2022-01-21 DIAGNOSIS — M25512 Pain in left shoulder: Secondary | ICD-10-CM | POA: Diagnosis not present

## 2022-02-03 DIAGNOSIS — M25512 Pain in left shoulder: Secondary | ICD-10-CM | POA: Diagnosis not present

## 2022-02-06 ENCOUNTER — Other Ambulatory Visit: Payer: Self-pay | Admitting: Behavioral Health

## 2022-02-06 DIAGNOSIS — F410 Panic disorder [episodic paroxysmal anxiety] without agoraphobia: Secondary | ICD-10-CM

## 2022-02-06 DIAGNOSIS — F411 Generalized anxiety disorder: Secondary | ICD-10-CM

## 2022-02-08 NOTE — Telephone Encounter (Signed)
Please call to schedule an appt, is overdue.

## 2022-02-09 NOTE — Telephone Encounter (Signed)
Lvm for pt to call and schedule

## 2022-02-10 DIAGNOSIS — M25512 Pain in left shoulder: Secondary | ICD-10-CM | POA: Diagnosis not present

## 2022-02-26 DIAGNOSIS — M25512 Pain in left shoulder: Secondary | ICD-10-CM | POA: Diagnosis not present

## 2022-03-11 DIAGNOSIS — M25512 Pain in left shoulder: Secondary | ICD-10-CM | POA: Diagnosis not present

## 2022-03-24 DIAGNOSIS — M25512 Pain in left shoulder: Secondary | ICD-10-CM | POA: Diagnosis not present

## 2022-03-31 DIAGNOSIS — M25512 Pain in left shoulder: Secondary | ICD-10-CM | POA: Diagnosis not present

## 2022-04-07 DIAGNOSIS — M25512 Pain in left shoulder: Secondary | ICD-10-CM | POA: Diagnosis not present

## 2022-04-19 ENCOUNTER — Other Ambulatory Visit: Payer: Self-pay | Admitting: Behavioral Health

## 2022-04-19 DIAGNOSIS — F410 Panic disorder [episodic paroxysmal anxiety] without agoraphobia: Secondary | ICD-10-CM

## 2022-04-19 DIAGNOSIS — F411 Generalized anxiety disorder: Secondary | ICD-10-CM

## 2022-05-03 DIAGNOSIS — M25512 Pain in left shoulder: Secondary | ICD-10-CM | POA: Diagnosis not present

## 2022-07-14 ENCOUNTER — Encounter: Payer: Self-pay | Admitting: Behavioral Health

## 2022-07-14 ENCOUNTER — Ambulatory Visit (INDEPENDENT_AMBULATORY_CARE_PROVIDER_SITE_OTHER): Payer: BC Managed Care – PPO | Admitting: Behavioral Health

## 2022-07-14 DIAGNOSIS — F410 Panic disorder [episodic paroxysmal anxiety] without agoraphobia: Secondary | ICD-10-CM | POA: Diagnosis not present

## 2022-07-14 DIAGNOSIS — F411 Generalized anxiety disorder: Secondary | ICD-10-CM

## 2022-07-14 MED ORDER — ESCITALOPRAM OXALATE 20 MG PO TABS: ORAL_TABLET | ORAL | 1 refills | Status: DC

## 2022-07-14 NOTE — Progress Notes (Signed)
Wendy Cherry [id number] April 30, 2000 21 y.o.  Virtual Visit via Telephone Note  I connected with pt on 07/14/22 at 10:00 AM EDT by telephone and verified that I am speaking with the correct person using two identifiers.   I discussed the limitations, risks, security and privacy concerns of performing an evaluation and management service by telephone and the availability of in person appointments. I also discussed with the patient that there may be a patient responsible charge related to this service. The patient expressed understanding and agreed to proceed.   I discussed the assessment and treatment plan with the patient. The patient was provided an opportunity to ask questions and all were answered. The patient agreed with the plan and demonstrated an understanding of the instructions.   The patient was advised to call back or seek an in-person evaluation if the symptoms worsen or if the condition fails to improve as anticipated.  I provided 20 minutes of non-face-to-face time during this encounter.  The patient was located at home.  The provider was located at [facility].   [name], NP   Subjective:   Patient ID:  Wendy Cherry is a 22 y.o. (DOB 03/07/2001) female.  Chief Complaint: No chief complaint on file.   HPI  Wendy Cherry presents for follow-up and medication management.  Says that she continues to have good stability and no pending concerns at this time. Needs refills  She says her anxiety today is 0/10 and depression is 0/10. She is sleeping 7 plus hours per night. No mania, no psychosis, no SI/HI. She agrees to follow up in 6 months.   Past psychiatric medication trial: Wellbutrin  Review of Systems:  Review of Systems  Constitutional: Negative.   Allergic/Immunologic: Negative.   Neurological: Negative.   Psychiatric/Behavioral: Negative.      Medications: I have reviewed the patient's current medications.  Current Outpatient Medications   Medication Sig Dispense Refill   clonazePAM (KLONOPIN) 0.5 MG tablet Take 0.25 mg by mouth 2 (two) times daily. (Patient not taking: Reported on 05/11/2021)     escitalopram (LEXAPRO) 20 MG tablet TAKE 1 TABLET(20 MG) BY MOUTH AT BEDTIME 90 tablet 0   gabapentin (NEURONTIN) 100 MG capsule Take 200 mg by mouth 2 (two) times daily.     ibuprofen (ADVIL) 200 MG tablet Take 600 mg by mouth every 6 (six) hours as needed for headache or moderate pain.     levonorgestrel-ethinyl estradiol (SEASONALE) 0.15-0.03 MG tablet Take 1 tablet by mouth daily.     ondansetron (ZOFRAN) 4 MG tablet Take 1 tablet (4 mg total) by mouth every 8 (eight) hours as needed for nausea or vomiting. 30 tablet 0   oxyCODONE (OXY IR/ROXICODONE) 5 MG immediate release tablet Take 1 tablet (5 mg total) by mouth every 4 (four) hours as needed. 40 tablet 0   promethazine-dextromethorphan (PROMETHAZINE-DM) 6.25-15 MG/5ML syrup Take 5 mLs by mouth 3 (three) times daily as needed for cough. 118 mL 0   No current facility-administered medications for this visit.    Medication Side Effects: None  Allergies: No Known Allergies  Past Medical History:  Diagnosis Date   Anxiety     No family history on file.  Social History   Socioeconomic History   Marital status: Single    Spouse name: Not on file   Number of children: Not on file   Years of education: Not on file   Highest education level: Not on file  Occupational History  Not on file  Tobacco Use   Smoking status: Never   Smokeless tobacco: Never  Vaping Use   Vaping Use: Every day  Substance and Sexual Activity   Alcohol use: Never   Drug use: Never   Sexual activity: Never  Other Topics Concern   Not on file  Social History Narrative   Not on file   Social Determinants of Health   Financial Resource Strain: Not on file  Food Insecurity: Not on file  Transportation Needs: Not on file  Physical Activity: Not on file  Stress: Not on file  Social  Connections: Not on file  Intimate Partner Violence: Not on file    Past Medical History, Surgical history, Social history, and Family history were reviewed and updated as appropriate.   Please see review of systems for further details on the patient's review from today.   Objective:   Physical Exam:  There were no vitals taken for this visit.  Physical Exam  Lab Review:     Component Value Date/Time   NA 140 08/23/2019 1047   K 3.9 08/23/2019 1047   CL 109 08/23/2019 1047   CO2 24 08/23/2019 1047   GLUCOSE 87 08/23/2019 1047   BUN 10 08/23/2019 1047   CREATININE 0.85 08/23/2019 1047   CALCIUM 8.8 (L) 08/23/2019 1047   GFRNONAA >60 08/23/2019 1047   GFRAA >60 08/23/2019 1047       Component Value Date/Time   WBC 6.2 08/23/2019 1047   RBC 4.60 08/23/2019 1047   HGB 13.1 08/23/2019 1047   HCT 38.2 08/23/2019 1047   PLT 221 08/23/2019 1047   MCV 83.0 08/23/2019 1047   MCH 28.5 08/23/2019 1047   MCHC 34.3 08/23/2019 1047   RDW 12.4 08/23/2019 1047   LYMPHSABS 2.4 08/23/2019 1047   MONOABS 0.4 08/23/2019 1047   EOSABS 0.1 08/23/2019 1047   BASOSABS 0.0 08/23/2019 1047    No results found for: "POCLITH", "LITHIUM"   No results found for: "PHENYTOIN", "PHENOBARB", "VALPROATE", "CBMZ"   .res Assessment: Plan:    Recommendations  Greater than 50% of 20 min face to face time with patient was spent on counseling and coordination of care. We discussed her stability with anxiety and depression. She wants to focus more on her depression right now.  Wellbutrin is working well and Pt does not want to change medications at this time. Will continue Lexapro 20 mg daily Continue Wellbutrin 150 mg XL daily  To reports worsening symptoms To follow up in 6 weeks to reassess Provided emergency contact information Reviewed PDMP  Arlys John A. Martine Trageser, NP   Diagnoses and all orders for this visit:  Generalized anxiety disorder  Panic disorder    Please see After Visit Summary  for patient specific instructions.  Future Appointments  Date Time Provider Department Center  07/20/2022  3:20 PM Pardue, Monico Blitz, DO [facility]    No orders of the defined types were placed in this encounter.     -------------------------------

## 2022-07-19 ENCOUNTER — Telehealth: Payer: Self-pay | Admitting: Family Medicine

## 2022-07-20 ENCOUNTER — Ambulatory Visit: Payer: BC Managed Care – PPO | Admitting: Family Medicine

## 2022-07-20 ENCOUNTER — Encounter: Payer: Self-pay | Admitting: Family Medicine

## 2022-07-20 VITALS — BP 121/74 | HR 82 | Temp 98.4°F | Ht 66.0 in | Wt 242.0 lb

## 2022-07-20 DIAGNOSIS — E668 Other obesity: Secondary | ICD-10-CM

## 2022-07-20 DIAGNOSIS — F411 Generalized anxiety disorder: Secondary | ICD-10-CM

## 2022-07-20 DIAGNOSIS — F330 Major depressive disorder, recurrent, mild: Secondary | ICD-10-CM

## 2022-07-20 DIAGNOSIS — Z8639 Personal history of other endocrine, nutritional and metabolic disease: Secondary | ICD-10-CM

## 2022-07-20 DIAGNOSIS — Z6839 Body mass index (BMI) 39.0-39.9, adult: Secondary | ICD-10-CM

## 2022-07-20 DIAGNOSIS — Z0000 Encounter for general adult medical examination without abnormal findings: Secondary | ICD-10-CM | POA: Diagnosis not present

## 2022-07-20 DIAGNOSIS — E282 Polycystic ovarian syndrome: Secondary | ICD-10-CM

## 2022-07-20 DIAGNOSIS — F809 Developmental disorder of speech and language, unspecified: Secondary | ICD-10-CM

## 2022-07-20 DIAGNOSIS — F410 Panic disorder [episodic paroxysmal anxiety] without agoraphobia: Secondary | ICD-10-CM | POA: Diagnosis not present

## 2022-07-20 DIAGNOSIS — K582 Mixed irritable bowel syndrome: Secondary | ICD-10-CM

## 2022-07-20 NOTE — Progress Notes (Unsigned)
New patient visit   Patient: Wendy Cherry   DOB: Nov 15, 2000   21 y.o. Female  MRN: [id number] Visit Date: 07/20/2022  Today's healthcare provider: Sherlyn Hay, DO   No chief complaint on file.  Subjective    Wendy Cherry is a 22 y.o. female who presents today as a new patient to establish care.  HPI   PCOS - diagnosed after her dad passed away 6-7 years ago; dx in 2018. Periods are now regular even though off BCP. Insulin resistance - insulin level 43.5 BCP - not sexually active IBS - Has to have a BM most instances after eating. Has 2-3 BMs per day every day. Eats 1-4 x pre day.  Some stools are formed, some loose (not often).  Watery stool - 3-4x per month. Occasional small hard stool once per month. Has been ongoing for three years. No cramping, abd pain.  Used to throw up once per week, now a couple times per month. Diet: "not great" - cheese sticks today; chick-fil-a and a biscuit with peanut butter yesterday.  Lexapro - still on it; has been on since seventh grade. Neck pain - seen by neuro (had shoulder surgery previously) - on gabapentin (3rd surgery on shoulder in October 2023. Feels like really bad tension at baseline, then feels a stabbing type sensation sometimes, including when her heart rate increases. Chiropractor did X-rays and saw bone spurs on some vertebrae.  Gets bad enough she can't get out of bed or stand up. Has been ongoing for at least three years per pt and mom.  Went to neuro initially for neck pain ? Dyslexia - referral    Past Medical History:  Diagnosis Date   Anxiety    Depression    Past Surgical History:  Procedure Laterality Date   SHOULDER ARTHROSCOPY WITH LABRAL REPAIR Left 08/23/2019   Procedure: LEFT SHOULDER ARTHROSCOPY WITH LABRAL REPAIR;  Surgeon: Juanell Fairly, MD;  Location: [facility];  Service: Orthopedics;  Laterality: Left;   SHOULDER ARTHROSCOPY WITH LABRAL REPAIR Left 07/31/2020   Procedure: LEFT SHOULDER ARTHROSCOPY  WITH REVISION LABRAL REPAIR AND POSSIBLE BICEPS TENODESIS;  Surgeon: Juanell Fairly, MD;  Location: [facility];  Service: Orthopedics;  Laterality: Left;   Family Status  Relation Name Status   Mother  (Not Specified)   Father  (Not Specified)   Brother  (Not Specified)   MGM  (Not Specified)   Family History  Problem Relation Age of Onset   Depression Mother    Cancer Father        pancreatic   ADD / ADHD Brother    Diabetes Maternal Grandmother    Cancer Maternal Grandmother        breast   Social History   Socioeconomic History   Marital status: Single    Spouse name: Not on file   Number of children: Not on file   Years of education: Not on file   Highest education level: Not on file  Occupational History   Not on file  Tobacco Use   Smoking status: Never   Smokeless tobacco: Never  Vaping Use   Vaping Use: Every day  Substance and Sexual Activity   Alcohol use: Never   Drug use: Never   Sexual activity: Never  Other Topics Concern   Not on file  Social History Narrative   Not on file   Social Determinants of Health   Financial Resource Strain: Not on file  Food Insecurity: Not on  file  Transportation Needs: Not on file  Physical Activity: Not on file  Stress: Not on file  Social Connections: Not on file   Outpatient Medications Prior to Visit  Medication Sig   escitalopram (LEXAPRO) 20 MG tablet TAKE 1 TABLET(20 MG) BY MOUTH AT BEDTIME   gabapentin (NEURONTIN) 100 MG capsule Take 200 mg by mouth 2 (two) times daily.   ibuprofen (ADVIL) 200 MG tablet Take 600 mg by mouth every 6 (six) hours as needed for headache or moderate pain.   ondansetron (ZOFRAN) 4 MG tablet Take 1 tablet (4 mg total) by mouth every 8 (eight) hours as needed for nausea or vomiting.   [DISCONTINUED] levonorgestrel-ethinyl estradiol (SEASONALE) 0.15-0.03 MG tablet Take 1 tablet by mouth daily.   No facility-administered medications prior to visit.   No Known Allergies   There  is no immunization history on file for this patient.  Health Maintenance  Topic Date Due   COVID-19 Vaccine (1) Never done   HPV VACCINES (1 - 2-dose series) Never done   HIV Screening  Never done   Hepatitis C Screening  Never done   DTaP/Tdap/Td (1 - Tdap) Never done   PAP-Cervical Cytology Screening  Never done   PAP SMEAR-Modifier  Never done   INFLUENZA VACCINE  10/07/2022    Patient Care Team: Sherlyn Hay, DO as PCP - General (Family Medicine)  Review of Systems  Constitutional: Negative.   HENT: Negative.    Eyes: Negative.   Respiratory: Negative.    Cardiovascular: Negative.   Gastrointestinal: Negative.   Endocrine: Negative.   Genitourinary: Negative.   Musculoskeletal:  Positive for neck pain.  Skin: Negative.   Allergic/Immunologic: Negative.   Neurological:  Positive for headaches.  Hematological: Negative.   Psychiatric/Behavioral:  The patient is nervous/anxious.     {Labs  Heme  Chem  Endocrine  Serology  Results Review (optional):23779}   Objective    BP 121/74 (BP Location: Right Arm, Patient Position: Sitting, Cuff Size: Large)   Pulse 82   Temp 98.4 F (36.9 C) (Oral)   Ht 5\' 6"  (1.676 m)   Wt 242 lb (109.8 kg)   SpO2 99%   BMI 39.06 kg/m  {Show previous vital signs (optional):23777}  Physical Exam ***  Depression Screen    07/20/2022    3:33 PM  PHQ 2/9 Scores  PHQ - 2 Score 0  PHQ- 9 Score 0   No results found for any visits on 07/20/22.  Assessment & Plan     ***   Food diary x2 wks PAP smear next time - cytology alone  No follow-ups on file.     {provider attestation***:1}   Sherlyn Hay, DO  [facility] [phone] (phone) [phone] (fax)  [facility]

## 2022-07-20 NOTE — Assessment & Plan Note (Signed)
Patient's anxiety is well-controlled with Lexapro.  She currently has six month prescription from psych.  Will refill when that runs out.  Discussed with patient that, because she is well-controlled, we can consider attempting to reduce her dose or potentially wean her off Lexapro in the future.  Discussed that, even if she is able to wean in the future, it does not mean she will not need it again beyond that.  Will not be making any changes to the patient's dosage at this time though.  She will continue Lexapro 20 mg daily.

## 2022-07-22 ENCOUNTER — Encounter: Payer: Self-pay | Admitting: Family Medicine

## 2022-07-22 DIAGNOSIS — F330 Major depressive disorder, recurrent, mild: Secondary | ICD-10-CM | POA: Insufficient documentation

## 2022-07-22 DIAGNOSIS — E282 Polycystic ovarian syndrome: Secondary | ICD-10-CM | POA: Insufficient documentation

## 2022-07-22 DIAGNOSIS — Z8639 Personal history of other endocrine, nutritional and metabolic disease: Secondary | ICD-10-CM | POA: Insufficient documentation

## 2022-07-22 DIAGNOSIS — E669 Obesity, unspecified: Secondary | ICD-10-CM | POA: Insufficient documentation

## 2022-07-22 DIAGNOSIS — E668 Other obesity: Secondary | ICD-10-CM | POA: Insufficient documentation

## 2022-07-22 DIAGNOSIS — Z6839 Body mass index (BMI) 39.0-39.9, adult: Secondary | ICD-10-CM | POA: Insufficient documentation

## 2022-07-22 DIAGNOSIS — E559 Vitamin D deficiency, unspecified: Secondary | ICD-10-CM | POA: Insufficient documentation

## 2022-07-22 NOTE — Assessment & Plan Note (Signed)
Patient is having regular periods without any significant concerning features (cramping/excessive bleeding).  She is not sexually active and does not desire birth control at this time.  Will not restart birth control and will continue to monitor.

## 2022-07-22 NOTE — Assessment & Plan Note (Signed)
Well-controlled on Lexapro.  No recent episodes of panic attacks.

## 2022-07-22 NOTE — Assessment & Plan Note (Signed)
Well controlled with Lexapro ?

## 2022-07-23 DIAGNOSIS — Z8639 Personal history of other endocrine, nutritional and metabolic disease: Secondary | ICD-10-CM | POA: Diagnosis not present

## 2022-07-23 DIAGNOSIS — Z0000 Encounter for general adult medical examination without abnormal findings: Secondary | ICD-10-CM | POA: Diagnosis not present

## 2022-07-28 ENCOUNTER — Ambulatory Visit: Payer: BC Managed Care – PPO | Attending: Family Medicine | Admitting: Speech Pathology

## 2022-07-28 DIAGNOSIS — F810 Specific reading disorder: Secondary | ICD-10-CM | POA: Insufficient documentation

## 2022-07-28 DIAGNOSIS — R6889 Other general symptoms and signs: Secondary | ICD-10-CM

## 2022-07-28 DIAGNOSIS — F809 Developmental disorder of speech and language, unspecified: Secondary | ICD-10-CM | POA: Insufficient documentation

## 2022-07-29 NOTE — Therapy (Signed)
OUTPATIENT SPEECH LANGUAGE PATHOLOGY EVALUATION   Patient Name: Wendy Cherry MRN: [id number] DOB:2000/07/05, 22 y.o., female Today's Date: 07/29/2022  PCP: Jacquenette Shone, DO REFERRING PROVIDER: Jacquenette Shone, DO   End of Session - 07/29/22 1226     Visit Number 1    Number of Visits 9    Date for SLP Re-Evaluation 09/22/22    Authorization Type [employer] COMM PPO    Authorization - Visit Number 1    Authorization - Number of Visits 60    Progress Note Due on Visit 10    SLP Start Time 1100    SLP Stop Time  1145    SLP Time Calculation (min) 45 min    Activity Tolerance Patient tolerated treatment well             Past Medical History:  Diagnosis Date   Anxiety    Depression    Past Surgical History:  Procedure Laterality Date   SHOULDER ARTHROSCOPY WITH LABRAL REPAIR Left 08/23/2019   Procedure: LEFT SHOULDER ARTHROSCOPY WITH LABRAL REPAIR;  Surgeon: Juanell Fairly, MD;  Location: [facility];  Service: Orthopedics;  Laterality: Left;   SHOULDER ARTHROSCOPY WITH LABRAL REPAIR Left 07/31/2020   Procedure: LEFT SHOULDER ARTHROSCOPY WITH REVISION LABRAL REPAIR AND POSSIBLE BICEPS TENODESIS;  Surgeon: Juanell Fairly, MD;  Location: [facility];  Service: Orthopedics;  Laterality: Left;   Patient Active Problem List   Diagnosis Date Noted   History of vitamin D deficiency 07/22/2022   History of insulin resistance 07/22/2022   Mild episode of recurrent major depressive disorder (HCC) 07/22/2022   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 07/22/2022   Body mass index (BMI) 39.0-39.9, adult 07/22/2022   Polycystic ovarian syndrome 07/22/2022   Generalized anxiety disorder 08/09/2018   Premenstrual dysphoric disorder 08/09/2018   Panic disorder 08/09/2018   Attention deficit hyperactivity disorder (ADHD), combined type, mild 08/09/2018    ONSET DATE: ongoing since childhood, date of referral    REFERRING DIAG: F80.9 (ICD-10-CM) - Language difficulty    THERAPY DIAG:  Reading difficulty  Difficulty writing  Language difficulty  Rationale for Evaluation and Treatment Rehabilitation  SUBJECTIVE:   SUBJECTIVE STATEMENT: Pt pleasant, appears to be good historian Pt accompanied by: family member, her mother  PERTINENT HISTORY: Pt is a 22 year old female with self-reported history of difficulty with reading comprehension, spelling, writing. Despite these struggles, pt graduated from high school with good grades, is attending community college (online) part-time and is working part-time. Pt with medical history possible for anxiety and panic attacks; well controlled, migraines.     DIAGNOSTIC FINDINGS:  MRI -011/18/2021 Negative MRI of the brain. No acute or focal lesion to explain the patient's migraine headaches or neck pain.  PAIN:  Are you having pain? No  FALLS: Has patient fallen in last 6 months?  No  LIVING ENVIRONMENT: Lives with: lives with their family Lives in: House/apartment  PLOF:  Level of assistance: Independent with ADLs, Independent with IADLs Employment: Part-time employment; part-time Archivist   PATIENT GOALS    to improve reading and writing skills   OBJECTIVE:  COGNITION: Overall cognitive status: Within functional limits for tasks assessed  AUDITORY COMPREHENSION: Overall auditory comprehension: Appears intact  READING COMPREHENSION: Impaired: paragraph  EXPRESSION: verbal  VERBAL EXPRESSION: Overall verbal expression: Appears intact  WRITTEN EXPRESSION: Dominant hand: right  Written expression: Impaired: word, phrase, and sentence; spelling errors  MOTOR SPEECH: Overall motor speech: Appears intact  ORAL MOTOR EXAMINATION Facial :  WFL Lingual: WFL Velum: WFL Mandible: WFL Cough: WFL Voice: WFL   STANDARDIZED ASSESSMENTS: Portions of the Reading Comprehension Battery for aphasia were used to assess basic reading comprehension at the paragraph level. Pt required  moderate assistance tot answer basic comprehension questions after reading 4-5 sentence basic paragraph.   TalkPath Therapy app utilized for working memory and auditory, all of which appeared to be within functional limits.   Pt given writing prompt - "Describe Your Dream Job." Pt was unable to begin written response. She benefited from SLP support "Who, What, When, What Services." Pt's response conveyed meaning but did contain spelling erros that pt was not aware of.     PATIENT REPORTED OUTCOME MEASURES (PROM): To be completed within next 3 sessions   TODAY'S TREATMENT:  N/A   PATIENT EDUCATION: Education details: results of the assessment, ST POC Person educated: Patient and Parent Education method: Explanation Education comprehension: verbalized understanding  HOME EXERCISE PROGRAM:   N/A    GOALS:  Goals reviewed with patient? Yes  SHORT TERM GOALS: Target date: 10 sessions  With Min A, pt will using reading comprehension strategies to improve comprehension at the 4-5 sentence basic paragraph level.  Baseline: maximal Goal status: INITIAL  2.  With Min A, pt will use effective writing strategies to generate a 3-4 sentence written response to basic writing prompt.  Baseline: moderate Goal status: INITIAL  3.  With Min A, pt will self-monitor and correct basic spelling errors at the 4-5 word sentence level.  Baseline: moderate Goal status: INITIAL   LONG TERM GOALS: Target date: 09/22/2022  Pt will using reading comprehension strategies to improve comprehension at the 4-5 sentence basic paragraph level.  Baseline:  Goal status: INITIAL  2.  Pt will use effective writing strategies to generate a 3-4 sentence written response to basic writing prompt.  Baseline:  Goal status: INITIAL  3.  Pt will self-monitor and correct basic spelling errors at the 4-5 word sentence level.  Baseline:  Goal status: INITIAL   ASSESSMENT:  CLINICAL IMPRESSION: Patient is a  22 y.o. right handed female who was seen today for difficulty with learning. She exhibits difficulty with reading and writing that generate anxiety and have prompted pt to take online classes.  While pt is very motivated to participate in skilled ST intervention, she has a very high co-pay and might benefit from services found within the community to help with sustained growth.   OBJECTIVE IMPAIRMENTS include  reading and writing . These impairments are limiting patient from return to work and managing finances. Factors affecting potential to achieve goals and functional outcome are financial resources (pt has $70 co-pay) and chronic nature of deficits. Patient will benefit from skilled SLP services to address above impairments and improve overall function.  REHAB POTENTIAL: Good  PLAN: SLP FREQUENCY: 1-2x/week  SLP DURATION: 8 weeks  PLANNED INTERVENTIONS: SLP instruction and feedback, Compensatory strategies, and Patient/family education    Amilia Vandenbrink B. Dreama Saa, M.S., CCC-SLP, Tree surgeon Certified Brain Injury Specialist [facility]  [facility] Office [phone] Ascom [phone] Fax [phone]

## 2022-07-31 LAB — COMPREHENSIVE METABOLIC PANEL
ALT: 30 IU/L (ref 0–32)
AST: 27 IU/L (ref 0–40)
Albumin/Globulin Ratio: 1.6 (ref 1.2–2.2)
Albumin: 4.1 g/dL (ref 4.0–5.0)
Alkaline Phosphatase: 102 IU/L (ref 44–121)
BUN/Creatinine Ratio: 13 (ref 9–23)
BUN: 10 mg/dL (ref 6–20)
Bilirubin Total: 0.3 mg/dL (ref 0.0–1.2)
CO2: 20 mmol/L (ref 20–29)
Calcium: 9.3 mg/dL (ref 8.7–10.2)
Chloride: 107 mmol/L — ABNORMAL HIGH (ref 96–106)
Creatinine, Ser: 0.8 mg/dL (ref 0.57–1.00)
Globulin, Total: 2.6 g/dL (ref 1.5–4.5)
Glucose: 85 mg/dL (ref 70–99)
Potassium: 4.4 mmol/L (ref 3.5–5.2)
Sodium: 143 mmol/L (ref 134–144)
Total Protein: 6.7 g/dL (ref 6.0–8.5)
eGFR: 107 mL/min/{1.73_m2} (ref >59)

## 2022-07-31 LAB — LIPID PANEL
Chol/HDL Ratio: 3.1 ratio (ref 0.0–4.4)
Cholesterol, Total: 168 mg/dL (ref 100–199)
HDL: 55 mg/dL (ref >39)
LDL Chol Calc (NIH): 96 mg/dL (ref 0–99)
Triglycerides: 90 mg/dL (ref 0–149)
VLDL Cholesterol Cal: 17 mg/dL (ref 5–40)

## 2022-07-31 LAB — INSULIN, FREE AND TOTAL
Free Insulin: 25 uU/mL — ABNORMAL HIGH
Total Insulin: 27 uU/mL

## 2022-07-31 LAB — VITAMIN D 25 HYDROXY (VIT D DEFICIENCY, FRACTURES): Vit D, 25-Hydroxy: 22.3 ng/mL — ABNORMAL LOW (ref 30.0–100.0)

## 2022-08-03 ENCOUNTER — Ambulatory Visit: Payer: BC Managed Care – PPO | Admitting: Family Medicine

## 2022-08-03 NOTE — Progress Notes (Deleted)
    I,Latona Krichbaum,acting as a Neurosurgeon for [employer], DO.,have documented all relevant documentation on the behalf of [employer], DO,as directed by  [employer], DO while in the presence of Sherlyn Hay, DO.    Established patient visit   Patient: Wendy Cherry   DOB: 2000/07/14   22 y.o. Female  MRN: [id number] Visit Date: 08/03/2022  Today's healthcare provider: Sherlyn Hay, DO   No chief complaint on file.  Subjective    HPI  Anxiety, Follow-up  She was last seen for anxiety 2 weeks ago. Changes made at last visit include no changes.   She reports {excellent/good/fair/poor:19665} compliance with treatment. She reports {good/fair/poor:18685} tolerance of treatment. She {is/is not:21021397} having side effects. {document side effects if present:1}  She feels her anxiety is {Desc; severity:60313} and {improved/worse/unchanged:3041574} since last visit.  Symptoms: {Yes/No:20286} chest pain {Yes/No:20286} difficulty concentrating  {Yes/No:20286} dizziness {Yes/No:20286} fatigue  {Yes/No:20286} feelings of losing control {Yes/No:20286} insomnia  {Yes/No:20286} irritable {Yes/No:20286} palpitations  {Yes/No:20286} panic attacks {Yes/No:20286} racing thoughts  {Yes/No:20286} shortness of breath {Yes/No:20286} sweating  {Yes/No:20286} tremors/shakes    GAD-7 Results     No data to display          PHQ-9 Scores    07/20/2022    3:33 PM  PHQ9 SCORE ONLY  PHQ-9 Total Score 0    ---------------------------------------------------------------------------------------------------   Medications: Outpatient Medications Prior to Visit  Medication Sig   escitalopram (LEXAPRO) 20 MG tablet TAKE 1 TABLET(20 MG) BY MOUTH AT BEDTIME   gabapentin (NEURONTIN) 100 MG capsule Take 200 mg by mouth 2 (two) times daily.   ibuprofen (ADVIL) 200 MG tablet Take 600 mg by mouth every 6 (six) hours as needed for headache or moderate pain.   ondansetron (ZOFRAN) 4 MG  tablet Take 1 tablet (4 mg total) by mouth every 8 (eight) hours as needed for nausea or vomiting.   No facility-administered medications prior to visit.    Review of Systems  {Labs  Heme  Chem  Endocrine  Serology  Results Review (optional):23779}   Objective    There were no vitals taken for this visit. {Show previous Tylene Quashie signs (optional):23777}  Physical Exam  ***  No results found for any visits on 08/03/22.  Assessment & Plan     ***  No follow-ups on file.      {provider attestation***:1}   Sherlyn Hay, DO  [facility] [phone] (phone) [phone] (fax)  [facility]

## 2022-08-04 ENCOUNTER — Other Ambulatory Visit: Payer: Self-pay | Admitting: Family Medicine

## 2022-08-04 DIAGNOSIS — E559 Vitamin D deficiency, unspecified: Secondary | ICD-10-CM

## 2022-08-04 MED ORDER — VITAMIN D (ERGOCALCIFEROL) 1.25 MG (50000 UNIT) PO CAPS: 50000.0000 [IU] | ORAL_CAPSULE | ORAL | 1 refills | Status: DC

## 2022-08-05 ENCOUNTER — Telehealth: Payer: Self-pay | Admitting: Speech Pathology

## 2022-08-05 ENCOUNTER — Ambulatory Visit: Payer: BC Managed Care – PPO | Admitting: Speech Pathology

## 2022-08-05 NOTE — Telephone Encounter (Signed)
05/39/2024 1645 - I called pt with information regarding upcoming appt on 08/05/2022 requesting a call back. LVM.   Jovi Alvizo B. Dreama Saa, M.S., CCC-SLP, Tree surgeon Certified Brain Injury Specialist [facility]  [facility] Services Office [phone] Ascom [phone] Fax [phone]

## 2022-08-05 NOTE — Telephone Encounter (Signed)
08/05/2022 - Have not heard back from pt regarding message left yesterday. I called and left another voicemail for me regarding today's appt at 0900.   At 0911, have not heard from pt and pt is currently not present for today's session (0900).   0913 left a voicemail informing pt of need to return call should she wish to continue pursuing ST services. All further appts have been cancel d/t lack of response from pt.   Paulett Kaufhold B. Dreama Saa, M.S., CCC-SLP, Tree surgeon Certified Brain Injury Specialist [facility]  [facility] Rehabilitation Services Office [phone] Ascom [phone] Fax [phone]

## 2022-08-09 ENCOUNTER — Ambulatory Visit: Payer: BC Managed Care – PPO | Admitting: Speech Pathology

## 2022-08-10 ENCOUNTER — Ambulatory Visit: Payer: BC Managed Care – PPO | Admitting: Family Medicine

## 2022-08-10 NOTE — Progress Notes (Deleted)
      Established patient visit   Patient: Wendy Cherry   DOB: November 23, 2000   22 y.o. Female  MRN: [id number] Visit Date: 08/10/2022  Today's healthcare provider: Sherlyn Hay, DO   No chief complaint on file.  Subjective    HPI  ***  Medications: Outpatient Medications Prior to Visit  Medication Sig   escitalopram (LEXAPRO) 20 MG tablet TAKE 1 TABLET(20 MG) BY MOUTH AT BEDTIME   gabapentin (NEURONTIN) 100 MG capsule Take 200 mg by mouth 2 (two) times daily.   ibuprofen (ADVIL) 200 MG tablet Take 600 mg by mouth every 6 (six) hours as needed for headache or moderate pain.   ondansetron (ZOFRAN) 4 MG tablet Take 1 tablet (4 mg total) by mouth every 8 (eight) hours as needed for nausea or vomiting.   Vitamin D, Ergocalciferol, (DRISDOL) 1.25 MG (50000 UNIT) CAPS capsule Take 1 capsule (50,000 Units total) by mouth every 7 (seven) days.   No facility-administered medications prior to visit.    Review of Systems  {Labs  Heme  Chem  Endocrine  Serology  Results Review (optional):23779}   Objective    There were no vitals taken for this visit. {Show previous vital signs (optional):23777}  Physical Exam  ***  No results found for any visits on 08/10/22.  Assessment & Plan     ***  No follow-ups on file.      {provider attestation***:1}   Sherlyn Hay, DO  [facility] [phone] (phone) [phone] (fax)  [facility]

## 2022-08-11 ENCOUNTER — Ambulatory Visit: Payer: BC Managed Care – PPO | Admitting: Speech Pathology

## 2022-08-11 ENCOUNTER — Ambulatory Visit (INDEPENDENT_AMBULATORY_CARE_PROVIDER_SITE_OTHER): Payer: BC Managed Care – PPO | Admitting: Family Medicine

## 2022-08-11 ENCOUNTER — Other Ambulatory Visit (HOSPITAL_COMMUNITY)
Admission: RE | Admit: 2022-08-11 | Discharge: 2022-08-11 | Disposition: A | Discharge status: Home / Self Care | Payer: BC Managed Care – PPO | Source: Ambulatory Visit | Attending: Family Medicine | Admitting: Family Medicine

## 2022-08-11 ENCOUNTER — Encounter: Payer: Self-pay | Admitting: Family Medicine

## 2022-08-11 VITALS — BP 118/71 | HR 85 | Temp 98.6°F | Resp 14 | Ht 66.0 in | Wt 238.1 lb

## 2022-08-11 DIAGNOSIS — E669 Obesity, unspecified: Secondary | ICD-10-CM | POA: Diagnosis not present

## 2022-08-11 DIAGNOSIS — Z01411 Encounter for gynecological examination (general) (routine) with abnormal findings: Secondary | ICD-10-CM

## 2022-08-11 DIAGNOSIS — Z713 Dietary counseling and surveillance: Secondary | ICD-10-CM | POA: Diagnosis not present

## 2022-08-11 DIAGNOSIS — K5909 Other constipation: Secondary | ICD-10-CM | POA: Diagnosis not present

## 2022-08-11 NOTE — Progress Notes (Signed)
I,Wendy  Cherry,acting as a Neurosurgeon for [employer], DO.,have documented all relevant documentation on the behalf of [employer], DO,as directed by  [employer], DO while in the presence of Wendy Hay, DO.    Established patient visit   Patient: Wendy Cherry   DOB: 08/24/2000   21 y.o. Female  MRN: [id number] Visit Date: 08/11/2022  Today's healthcare provider: Sherlyn Hay, DO   Chief Complaint  Patient presents with   Gynecologic Exam   Subjective    HPI  Patient is here today for a Pap smear  No discharge, itching or discomfort. Not sexually active for past 1-1.5 years. Has had oral sex previously. No known family history of cervical cancer Maternal grandmother - diagnosed with breast cancer - thinks she was diagnosed in her 30s.  Obesity -  She has been working a lot more around the house Will be starting to walk their dogs around their field with her mom (they live on [place] acres). Will be doing a lot more physical activity soon at work too.  Food diary Food choices are fairly unhealthy, including SpaghettiOs and Lunchables.  They include almost no fruits or vegetables.  The softer, more formed stools she notes in her diarrhea seem to occur after her intake of more greasy foods.  Otherwise, most other stools are described as hard pebbles. Patient states that, after doing the food diary, she realized just how unhealthy her diet was.   Medications: Outpatient Medications Prior to Visit  Medication Sig   escitalopram (LEXAPRO) 20 MG tablet TAKE 1 TABLET(20 MG) BY MOUTH AT BEDTIME   gabapentin (NEURONTIN) 100 MG capsule Take 200 mg by mouth 2 (two) times daily.   ibuprofen (ADVIL) 200 MG tablet Take 600 mg by mouth every 6 (six) hours as needed for headache or moderate pain.   Vitamin D, Ergocalciferol, (DRISDOL) 1.25 MG (50000 UNIT) CAPS capsule Take 1 capsule (50,000 Units total) by mouth every 7 (seven) days.   ondansetron (ZOFRAN) 4 MG tablet Take 1  tablet (4 mg total) by mouth every 8 (eight) hours as needed for nausea or vomiting.   No facility-administered medications prior to visit.    Review of Systems  Gastrointestinal:  Positive for constipation (frequent).  Musculoskeletal:  Positive for neck pain (baseline).  All other systems reviewed and are negative.      Objective    BP 118/71 (BP Location: Right Arm, Patient Position: Sitting, Cuff Size: Normal)   Pulse 85   Temp 98.6 F (37 C) (Oral)   Resp 14   Ht 5\' 6"  (1.676 m)   Wt 238 lb 1.6 oz (108 kg)   LMP 07/25/2022 (Approximate)   SpO2 99%   BMI 38.43 kg/m    Physical Exam Vitals and nursing note reviewed.  Constitutional:      Appearance: Normal appearance. She is well-developed. She is obese.  HENT:     Head: Normocephalic.  Eyes:     Conjunctiva/sclera: Conjunctivae normal.  Neck:     Thyroid: No thyromegaly.  Cardiovascular:     Rate and Rhythm: Normal rate and regular rhythm.     Heart sounds: Normal heart sounds. No murmur heard.    No friction rub. No gallop.  Pulmonary:     Effort: Pulmonary effort is normal. No respiratory distress.     Breath sounds: Normal breath sounds. No wheezing or rales.  Chest:     Chest wall: No tenderness.  Abdominal:  General: Bowel sounds are normal. There is no distension.     Palpations: Abdomen is soft. There is no mass.     Tenderness: There is no guarding.  Genitourinary:    Labia:        Right: No tenderness or lesion.        Left: No tenderness or lesion.      Vagina: Vaginal discharge (small amount of white) present.     Cervix: No friability, lesion or cervical bleeding.  Musculoskeletal:        General: Normal range of motion.  Skin:    General: Skin is warm and dry.  Neurological:     Mental Status: She is alert and oriented to person, place, and time. Mental status is at baseline.     Deep Tendon Reflexes: Reflexes are normal and symmetric.  Psychiatric:        Mood and Affect: Mood  normal.        Behavior: Behavior normal.      Results for orders placed or performed in visit on 08/11/22  Cervicovaginal ancillary only  Result Value Ref Range   Bacterial Vaginitis (gardnerella) Negative    Comment      Normal Reference Range Bacterial Vaginosis - Negative    Assessment & Plan    1. Encounter for well woman exam with abnormal findings Well woman exam performed with a small amount of white discharge noted.  Samples obtained and Pap smear/BV testing ordered. - Cervicovaginal ancillary only - Cytology - PAP  2. Obesity (BMI 35.0-39.9 without comorbidity) - phentermine 37.5 MG capsule; Take 1 capsule (37.5 mg total) by mouth every morning.  Dispense: 30 capsule; Refill: 0  3. Weight loss counseling, encounter for Discussed various ways to improve her diet, including increasing her intake of fresh/frozen fruits and vegetables, legumes and fish.  I encouraged her to continue using her air fryer for any desired fried foods. I encouraged patient to measure/weigh her foods/beverages for a few days to get a more accurate idea of what different amounts of things look like on her plate or in her glass. I also suggested using smaller plates/glasses as it helps trick our minds into thinking we've eaten more than we have, as well as planning to eat until she is no longer hungry rather than until she's full. I also encouraged her to increase her exercise, which she already plans to do. - phentermine 37.5 MG capsule; Take 1 capsule (37.5 mg total) by mouth every morning.  Dispense: 30 capsule; Refill: 0  4. Chronic Constipation I suspect her constipation will improve as her diet improves and as she increases her exercise routine.  Will plan to follow-up at future visits.    Return in about 4 weeks (around 09/08/2022) for weight f/u.      The entirety of the information documented in the History of Present Illness, Review of Systems and Physical Exam were personally obtained by  me. Portions of this information were initially documented by the Wendy Cherry, and reviewed by me for thoroughness and accuracy.   I discussed the assessment and treatment plan with the patient  The patient was provided an opportunity to ask questions and all were answered. The patient agreed with the plan and demonstrated an understanding of the instructions.   The patient was advised to call back or seek an in-person evaluation if the symptoms worsen or if the condition fails to improve as anticipated.    Wendy Texeira N Shivali Quackenbush, DO  [facility] [phone] (phone) [phone] (fax)  [place]

## 2022-08-11 NOTE — Patient Instructions (Addendum)
Find out how old your grandmother was when she was diagnosed with breast cancer.  Increase your intake of fresh/frozen fruits and vegetables.  I also attached the Mediterranean diet information for you to reference.  I strongly encourage you to incorporate exercise into your daily routine (at least 30 minutes most days of the week) and monitor your caloric intake, restricting it to around 1800 calories per day (on top of anything you might do at work)  - As we discussed, I would encourage you to measure/weigh your foods/beverages for a few days to get a more accurate idea of what different amounts of things look like on your plate or in your glass.  - Using smaller plates/glasses also helps in that it tricks our minds into thinking we've eaten more than we have. Studies have shown that we eat more when presented with larger plates, even with the same amount of food on them.   - Plan to eat until you are no longer hungry rather than until you're full.  If you don't normally exercise, start with something simple or more enjoyable. You can plan to walk for your half hour or do something like dancing if you enjoy it.  Build up your activity over time; this will make it more enjoyable and reduce your risk of injury.  Even if these actions don't ultimately lead to weight loss, increasing your activity will still help to reduce your insulin resistance and will improve your cardiovascular health (making heart attack/stroke less likely as you age).

## 2022-08-12 ENCOUNTER — Encounter: Payer: Self-pay | Admitting: Family Medicine

## 2022-08-12 LAB — CERVICOVAGINAL ANCILLARY ONLY
Bacterial Vaginitis (gardnerella): NEGATIVE
Comment: NEGATIVE

## 2022-08-13 MED ORDER — PHENTERMINE HCL 37.5 MG PO CAPS: 37.5000 mg | ORAL_CAPSULE | ORAL | 0 refills | Status: DC

## 2022-08-16 LAB — CYTOLOGY - PAP
Comment: NEGATIVE
Diagnosis: NEGATIVE
High risk HPV: NEGATIVE

## 2022-08-19 ENCOUNTER — Encounter: Payer: Self-pay | Admitting: Speech Pathology

## 2022-08-23 ENCOUNTER — Encounter: Payer: Self-pay | Admitting: Family Medicine

## 2022-08-23 DIAGNOSIS — Z6839 Body mass index (BMI) 39.0-39.9, adult: Secondary | ICD-10-CM

## 2022-08-23 DIAGNOSIS — Z713 Dietary counseling and surveillance: Secondary | ICD-10-CM

## 2022-08-23 DIAGNOSIS — E668 Other obesity: Secondary | ICD-10-CM

## 2022-08-23 MED ORDER — SEMAGLUTIDE(0.25 OR 0.5MG/DOS) 2 MG/3ML ~~LOC~~ SOPN
0.2500 mg | PEN_INJECTOR | SUBCUTANEOUS | 1 refills | Status: DC
Start: 2022-08-23 — End: 2022-08-24

## 2022-08-24 ENCOUNTER — Other Ambulatory Visit: Payer: Self-pay | Admitting: Family Medicine

## 2022-08-24 ENCOUNTER — Telehealth: Payer: Self-pay | Admitting: Family Medicine

## 2022-08-24 DIAGNOSIS — Z6839 Body mass index (BMI) 39.0-39.9, adult: Secondary | ICD-10-CM

## 2022-08-24 DIAGNOSIS — Z8639 Personal history of other endocrine, nutritional and metabolic disease: Secondary | ICD-10-CM

## 2022-08-24 DIAGNOSIS — E668 Other obesity: Secondary | ICD-10-CM

## 2022-08-24 MED ORDER — SEMAGLUTIDE-WEIGHT MANAGEMENT 0.25 MG/0.5ML ~~LOC~~ SOAJ: 0.2500 mg | SUBCUTANEOUS | 0 refills | Status: DC

## 2022-08-24 NOTE — Telephone Encounter (Signed)
[facility] pharmacy needs prior authorization Ozempic 0.25 or 0.5mg /DOS1x2mg  Please advise

## 2022-08-24 NOTE — Telephone Encounter (Signed)
PA started

## 2022-08-24 NOTE — Telephone Encounter (Signed)
Status: PA Response - Denied Request Reference Number: [id number]. OZEMPIC INJ 2MG /3ML is denied for not meeting the prior authorization requirement(s). Details of this decision are in the notice attached below or have been faxed to you.

## 2022-09-01 ENCOUNTER — Encounter: Payer: Self-pay | Admitting: Speech Pathology

## 2022-09-02 ENCOUNTER — Telehealth: Payer: Self-pay | Admitting: Family Medicine

## 2022-09-02 NOTE — Telephone Encounter (Signed)
[facility] pharmacy requesting prescription Wegovy 0.25mg /0.42ml INJ (4pens) Please advise

## 2022-09-03 ENCOUNTER — Encounter: Payer: Self-pay | Admitting: Speech Pathology

## 2022-09-06 ENCOUNTER — Ambulatory Visit: Payer: BC Managed Care – PPO | Admitting: Family Medicine

## 2022-09-06 NOTE — Progress Notes (Deleted)
      Established patient visit   Patient: Wendy Cherry   DOB: 11-04-2000   21 y.o. Female  MRN: [id number] Visit Date: 09/06/2022  Today's healthcare provider: Sherlyn Hay, DO   No chief complaint on file.  Subjective    HPI  How is diet-improvement going?        ***  Medications: Outpatient Medications Prior to Visit  Medication Sig   escitalopram (LEXAPRO) 20 MG tablet TAKE 1 TABLET(20 MG) BY MOUTH AT BEDTIME   gabapentin (NEURONTIN) 100 MG capsule Take 200 mg by mouth 2 (two) times daily.   ibuprofen (ADVIL) 200 MG tablet Take 600 mg by mouth every 6 (six) hours as needed for headache or moderate pain.   phentermine 37.5 MG capsule Take 1 capsule (37.5 mg total) by mouth every morning.   Semaglutide-Weight Management 0.25 MG/0.5ML SOAJ Inject 0.25 mg into the skin once a week for 28 days.   Vitamin D, Ergocalciferol, (DRISDOL) 1.25 MG (50000 UNIT) CAPS capsule Take 1 capsule (50,000 Units total) by mouth every 7 (seven) days.   No facility-administered medications prior to visit.    Review of Systems  {Labs  Heme  Chem  Endocrine  Serology  Results Review (optional):23779}   Objective    LMP 07/25/2022 (Approximate)  {Show previous vital signs (optional):23777}  Physical Exam  ***  No results found for any visits on 09/06/22.  Assessment & Plan     ***  No follow-ups on file.      {provider attestation***:1}   Sherlyn Hay, DO  [facility] [phone] (phone) [phone] (fax)  [facility]

## 2022-09-07 ENCOUNTER — Encounter: Payer: Self-pay | Admitting: Speech Pathology

## 2022-09-14 ENCOUNTER — Encounter: Payer: Self-pay | Admitting: Speech Pathology

## 2022-09-17 ENCOUNTER — Encounter: Payer: Self-pay | Admitting: Speech Pathology

## 2022-09-22 ENCOUNTER — Encounter: Payer: Self-pay | Admitting: Speech Pathology

## 2022-09-24 ENCOUNTER — Encounter: Payer: Self-pay | Admitting: Speech Pathology

## 2022-09-29 ENCOUNTER — Encounter: Payer: Self-pay | Admitting: Speech Pathology

## 2022-10-01 ENCOUNTER — Encounter: Payer: Self-pay | Admitting: Speech Pathology

## 2022-10-04 ENCOUNTER — Encounter: Payer: Self-pay | Admitting: Speech Pathology

## 2022-10-06 ENCOUNTER — Encounter: Payer: Self-pay | Admitting: Speech Pathology

## 2022-10-26 ENCOUNTER — Other Ambulatory Visit: Payer: Self-pay | Admitting: Family Medicine

## 2022-11-17 IMAGING — MR MR CERVICAL SPINE W/O CM
5 series · 40 of 48 positions shown · non-contrast
Comparison: None available.

CLINICAL DATA: Initial evaluation for bilateral neck pain, radiates
into frontal headaches.

EXAM:
MRI CERVICAL SPINE WITHOUT CONTRAST
TECHNIQUE: Multiplanar, multisequence MR imaging of the cervical spine was
performed. No intravenous contrast was administered.

[Series 5: T2 · sagittal · 3.0mm · 0.62mm/px · 6 of 15 slices shown (1 of 2)]
[im 1/15]
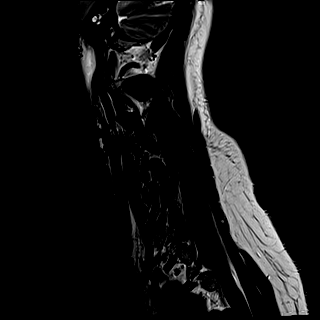
[im 3/15]
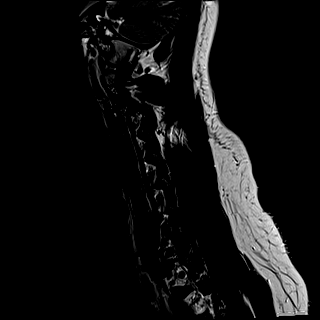
[im 6/15]
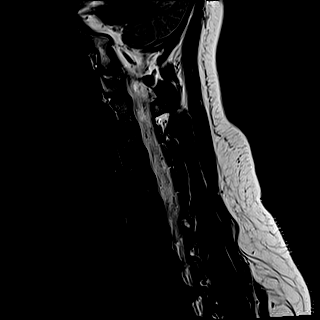
[im 9/15]
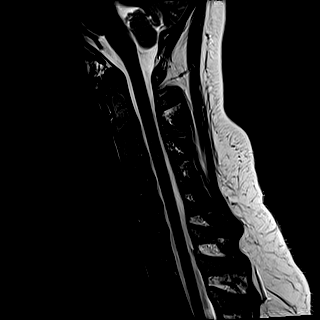
[im 12/15]
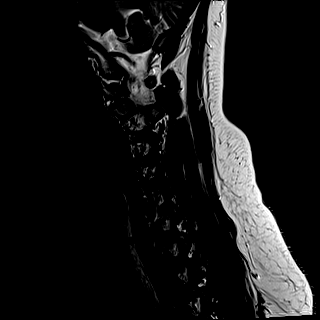
[im 15/15]
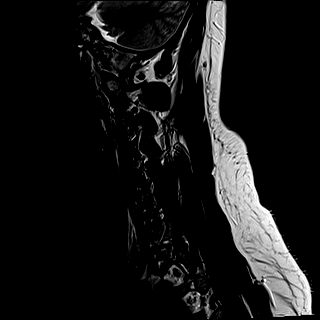

[Series 6: FLAIR · sagittal · 3.0mm · 0.78mm/px · 7 of 15 slices shown]
[im 1/15]
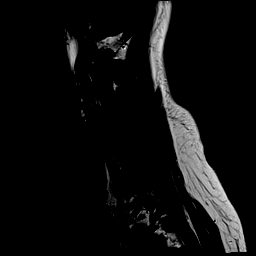
[im 3/15]
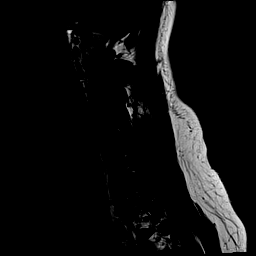
[im 5/15]
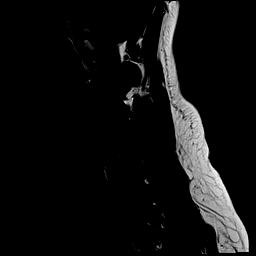
[im 8/15]
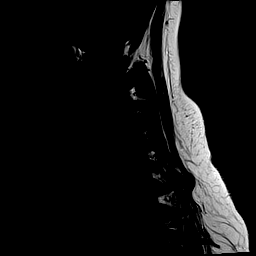
[im 10/15]
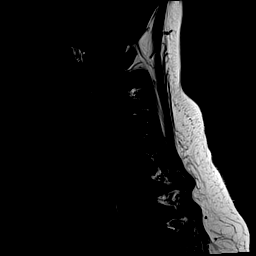
[im 12/15]
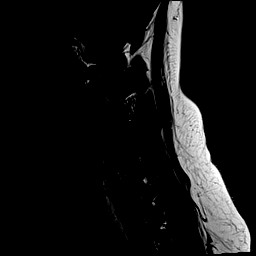
[im 15/15]
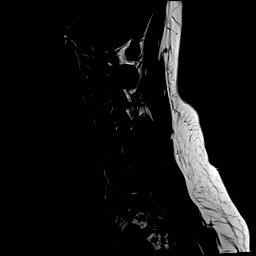

[Series 7: STIR · sagittal · 3.0mm · 0.62mm/px · 7 of 15 slices shown]
[im 1/15]
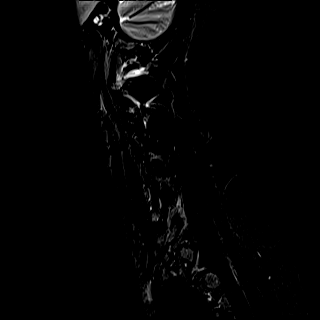
[im 3/15]
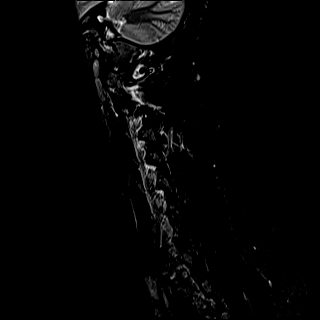
[im 5/15]
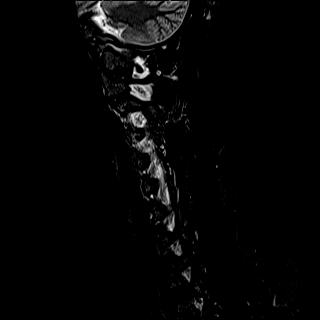
[im 8/15]
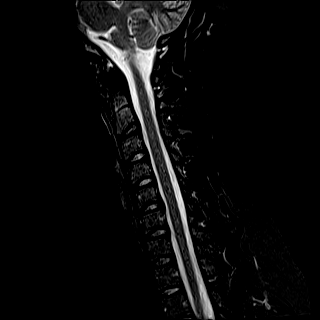
[im 10/15]
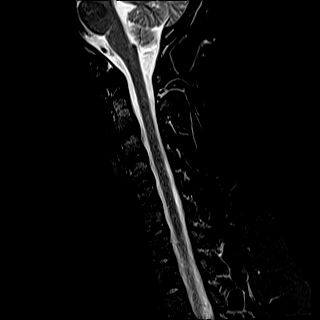
[im 12/15]
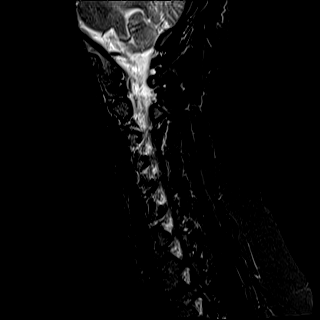
[im 15/15]
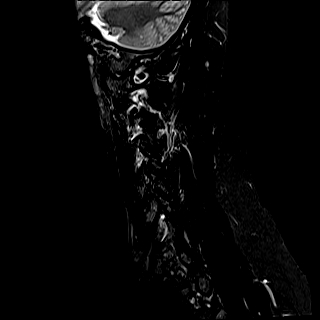

[Series 8: T2 · axial · 3.0mm · 0.70mm/px · z∈[-114,-23]mm · 12 of 29 slices shown (2 of 2)]
[im 1/29]
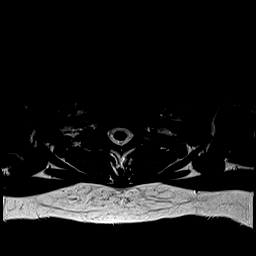
[im 3/29]
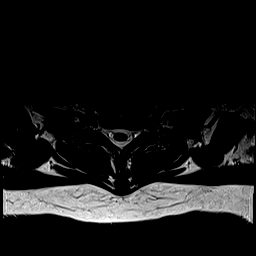
[im 5/29]
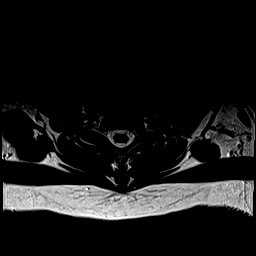
[im 7/29]
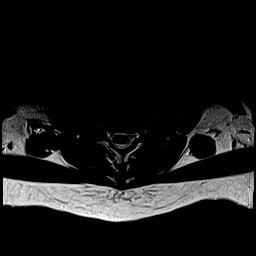
[im 9/29]
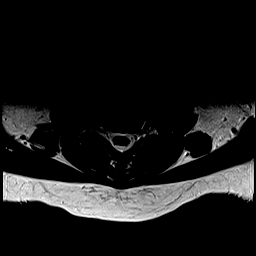
[im 11/29]
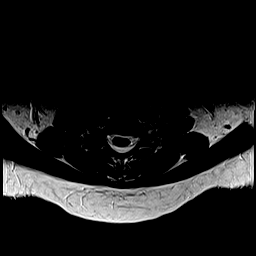
[im 13/29]
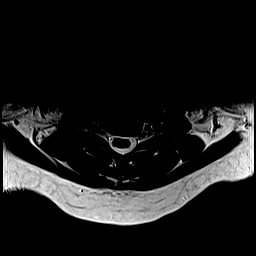
[im 16/29]
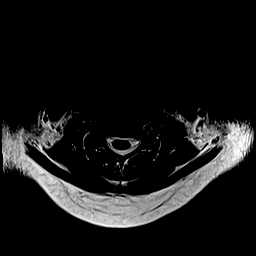
[im 18/29]
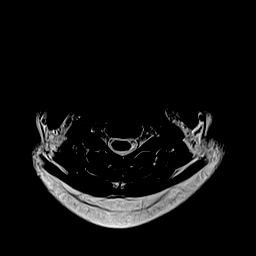
[im 20/29]
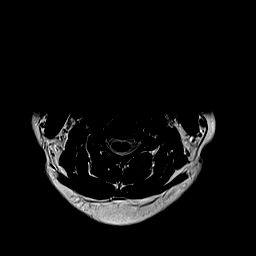
[im 24/29]
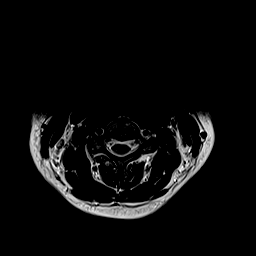
[im 29/29]
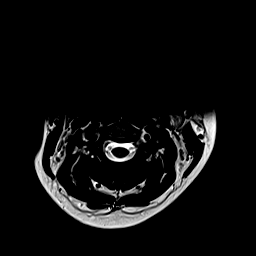

[Series 9: ax mpgr · axial · 3.0mm · 0.35mm/px · z∈[-114,-23]mm · 8 of 29 slices shown]
[im 1/29]
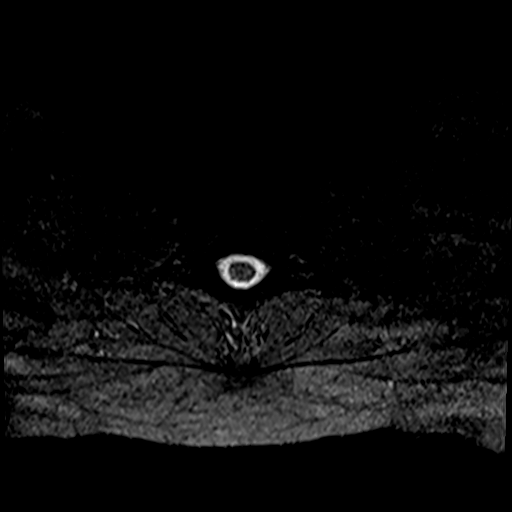
[im 5/29]
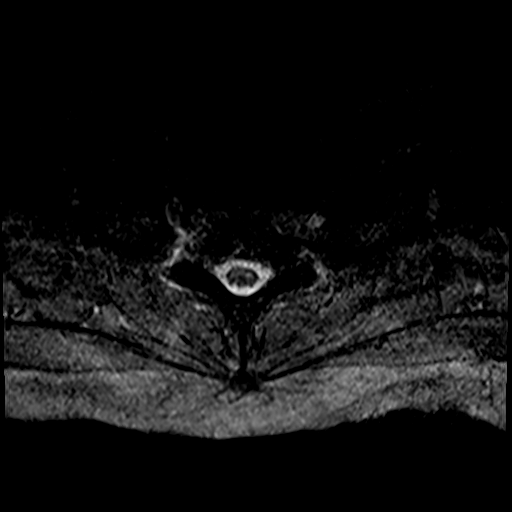
[im 9/29]
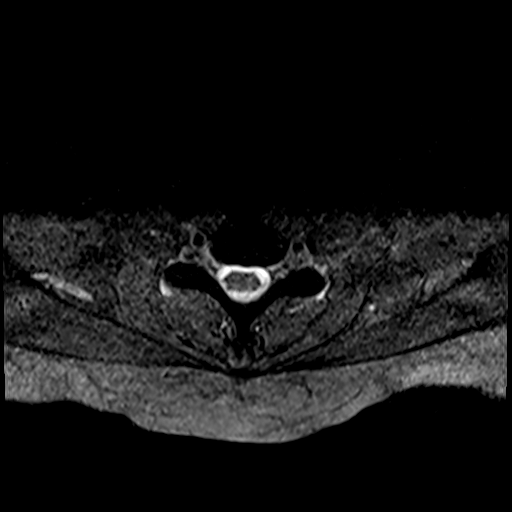
[im 13/29]
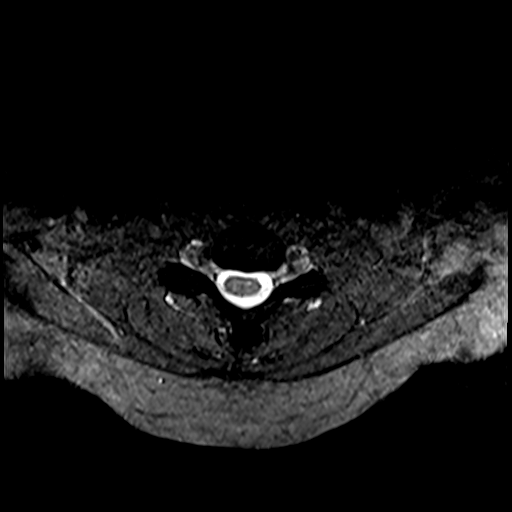
[im 16/29]
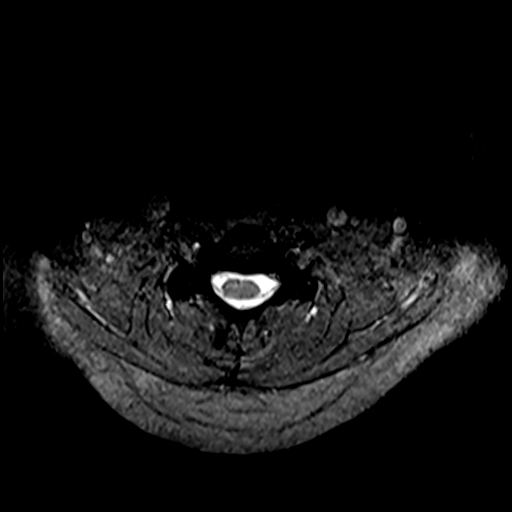
[im 20/29]
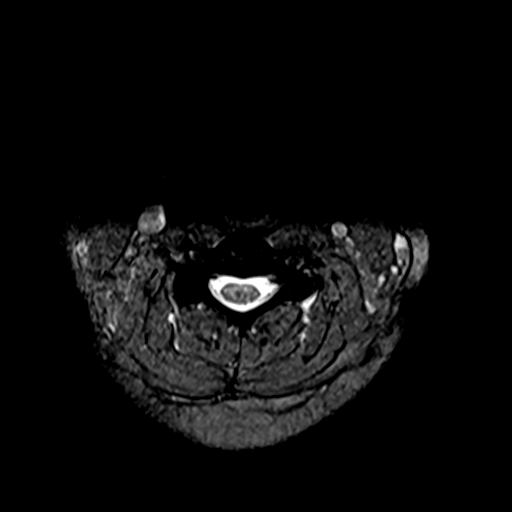
[im 24/29]
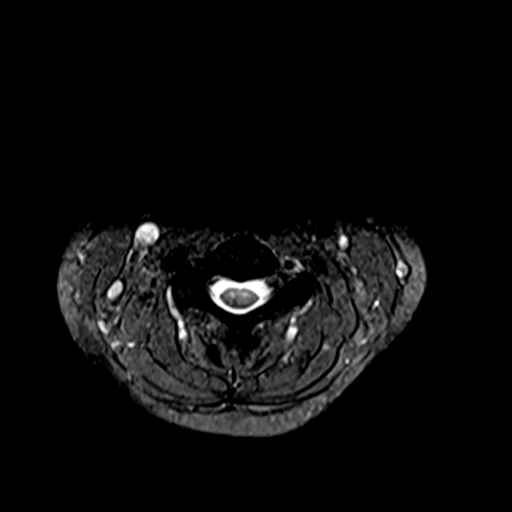
[im 29/29]
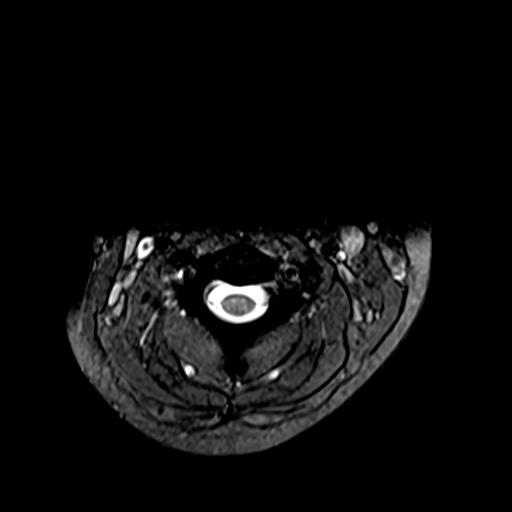

[40 of 48 positions shown; findings below may reference images not displayed]

FINDINGS: Alignment: Straightening of the normal cervical lordosis. No
listhesis.

Vertebrae: Vertebral body height maintained without acute or chronic
fracture. Bone marrow signal intensity within normal limits. No
discrete or worrisome osseous lesions. No abnormal marrow edema.

Cord: Normal signal and morphology.

Posterior Fossa, vertebral arteries, paraspinal tissues: Visualized
brain and posterior fossa within normal limits. Craniocervical
junction normal. Paraspinous and prevertebral soft tissues within
normal limits. Normal intravascular flow voids seen within the
vertebral arteries bilaterally.

Disc levels:

No significant disc pathology seen within the cervical spine.
Intervertebral discs are fairly well hydrated with preserved disc
height. No disc bulge or focal disc herniation. No significant facet
pathology. No canal or neural foraminal stenosis or evidence for
neural impingement.
IMPRESSION: Normal MRI of the cervical spine. No findings to explain patient's
symptoms identified.

## 2023-01-16 ENCOUNTER — Other Ambulatory Visit: Payer: Self-pay | Admitting: Behavioral Health

## 2023-01-16 DIAGNOSIS — F410 Panic disorder [episodic paroxysmal anxiety] without agoraphobia: Secondary | ICD-10-CM

## 2023-01-16 DIAGNOSIS — F411 Generalized anxiety disorder: Secondary | ICD-10-CM

## 2023-01-29 ENCOUNTER — Other Ambulatory Visit: Payer: Self-pay | Admitting: Family Medicine

## 2023-01-29 DIAGNOSIS — E559 Vitamin D deficiency, unspecified: Secondary | ICD-10-CM

## 2023-03-07 DIAGNOSIS — Z789 Other specified health status: Secondary | ICD-10-CM | POA: Diagnosis not present

## 2023-03-07 DIAGNOSIS — Z1152 Encounter for screening for COVID-19: Secondary | ICD-10-CM | POA: Diagnosis not present

## 2023-03-07 DIAGNOSIS — J00 Acute nasopharyngitis [common cold]: Secondary | ICD-10-CM | POA: Diagnosis not present

## 2023-03-07 DIAGNOSIS — Z6838 Body mass index (BMI) 38.0-38.9, adult: Secondary | ICD-10-CM | POA: Diagnosis not present

## 2023-03-07 DIAGNOSIS — M791 Myalgia, unspecified site: Secondary | ICD-10-CM | POA: Diagnosis not present

## 2023-04-02 ENCOUNTER — Other Ambulatory Visit: Payer: Self-pay | Admitting: Behavioral Health

## 2023-04-02 DIAGNOSIS — F411 Generalized anxiety disorder: Secondary | ICD-10-CM

## 2023-04-02 DIAGNOSIS — F410 Panic disorder [episodic paroxysmal anxiety] without agoraphobia: Secondary | ICD-10-CM

## 2023-04-18 ENCOUNTER — Other Ambulatory Visit: Payer: Self-pay | Admitting: Family Medicine

## 2023-04-18 DIAGNOSIS — F410 Panic disorder [episodic paroxysmal anxiety] without agoraphobia: Secondary | ICD-10-CM

## 2023-04-18 DIAGNOSIS — F411 Generalized anxiety disorder: Secondary | ICD-10-CM

## 2023-04-18 NOTE — Telephone Encounter (Signed)
 Medication Refill -  Most Recent Primary Care Visit:  Provider: Carlean Charter  Department: [id number]  Visit Type: OFFICE VISIT  Date: 08/11/2022  Medication: escitalopram  (LEXAPRO ) 20 MG tablet [[id number]]   Has the patient contacted their pharmacy? Yes  (Agent: If yes, when and what did the pharmacy advise?) Contact office. Pt says provider agreed to takeover the prescription for pt   Is this the correct pharmacy for this prescription? Yes  This is the patient's preferred pharmacy:  [id number] - [place], [place] - [address] AT [facility] [address] [facility] [address] [place] [address] Phone: [phone] Fax: [phone]  Pt says it has to be a 90 day supply in order for insurance to cover it.   Has the prescription been filled recently? Yes  Is the patient out of the medication? Yes  Has the patient been seen for an appointment in the last year OR does the patient have an upcoming appointment? Yes  Can we respond through MyChart? Yes  Agent: Please be advised that Rx refills may take up to 3 business days. We ask that you follow-up with your pharmacy.

## 2023-04-19 NOTE — Telephone Encounter (Signed)
Requested medications are due for refill today.  yes  Requested medications are on the active medications list.  yes  Last refill. 01/16/2023 #30 0 rf  Future visit scheduled.   yes  Notes to clinic.  Pt has an upcoming appt. Pt is out of medication. Pt states that Dr. Payton Mccallum was going to take over prescribing this medication. Rx signed by Avelina Laine.    Requested Prescriptions  Pending Prescriptions Disp Refills   escitalopram (LEXAPRO) 20 MG tablet 30 tablet 0    Sig: TAKE 1 TABLET(20 MG) BY MOUTH AT BEDTIME     Psychiatry:  Antidepressants - SSRI Failed - 04/19/2023  3:11 PM      Failed - Valid encounter within last 6 months    Recent Outpatient Visits           8 months ago Encounter for well woman exam with abnormal findings   [facility] [place], Monico Blitz, DO   9 months ago Generalized anxiety disorder   [facility] [place], Monico Blitz, DO       Future Appointments             In 6 days [place], Monico Blitz, DO [facility]            Passed - Completed PHQ-2 or PHQ-9 in the last 360 days

## 2023-04-21 MED ORDER — ESCITALOPRAM OXALATE 20 MG PO TABS: ORAL_TABLET | ORAL | 1 refills | Status: DC

## 2023-04-25 ENCOUNTER — Ambulatory Visit: Payer: BC Managed Care – PPO | Admitting: Family Medicine

## 2023-04-25 VITALS — BP 119/69 | HR 76 | Resp 16 | Ht 66.0 in | Wt 243.1 lb

## 2023-04-25 DIAGNOSIS — M62838 Other muscle spasm: Secondary | ICD-10-CM | POA: Diagnosis not present

## 2023-04-25 DIAGNOSIS — E559 Vitamin D deficiency, unspecified: Secondary | ICD-10-CM

## 2023-04-25 DIAGNOSIS — Z23 Encounter for immunization: Secondary | ICD-10-CM

## 2023-04-25 DIAGNOSIS — M5481 Occipital neuralgia: Secondary | ICD-10-CM | POA: Diagnosis not present

## 2023-04-25 DIAGNOSIS — Z1159 Encounter for screening for other viral diseases: Secondary | ICD-10-CM

## 2023-04-25 DIAGNOSIS — F411 Generalized anxiety disorder: Secondary | ICD-10-CM

## 2023-04-25 DIAGNOSIS — Z114 Encounter for screening for human immunodeficiency virus [HIV]: Secondary | ICD-10-CM

## 2023-04-25 MED ORDER — BACLOFEN 10 MG PO TABS: 10.0000 mg | ORAL_TABLET | Freq: Every evening | ORAL | 0 refills | Status: DC | PRN

## 2023-04-25 NOTE — Progress Notes (Unsigned)
 Established patient visit   Patient: Wendy Cherry   DOB: April 09, 2000   23 y.o. Female  MRN: [id number] Visit Date: 04/25/2023  Today's healthcare provider: Sherlyn Hay, DO   Chief Complaint  Patient presents with   Neck Pain   Subjective    HPI Vaccines, hiv/hcv  What happened with wegovy  Different doctors Saw neuro Saw chiropracter  Pain at back of right neck radiating up into head   Medications previously tried (reason stopped): Nortriptyline (20 mg - ineffective) [name] (ineffective) Occipital nerve block Gabapentin - drowsiness at higher dose clonazeam    Carbamazepine Pregabalin Baclofen are additional options to treat ***    Wendy Cherry is a 23 year old female who presents with persistent neck pain and associated symptoms.  She has been experiencing chronic neck pain for approximately four to five years, primarily located in the back of her neck and radiating down her arm, causing numbness and tingling. Previously, the pain was more severe, extending into her forehead and associated with migraines. Despite consulting multiple healthcare providers, including a neurologist and a chiropractor, and undergoing various treatments such as physical therapy, medications, and injections, she has found little relief. Her symptoms are often worse upon waking or develop towards the end of the day, sometimes improving with sleep. The pain is described as a throbbing sensation, occurring about once a week and lasting the entire day. She also experiences muscle knots in her neck, which she attempts to relieve with massage and topical treatments like tiger balm.  She has tried several medications including Effexor, nortriptyline, [name], gabapentin, and clonazepam, primarily for migraine management, but these have not significantly alleviated her neck pain. She has also received injections, possibly trigger point injections, in her upper back and neck, but cannot  recall the specifics. She currently takes Lexapro and uses ibuprofen as needed, taking up to 800 mg when the pain occurs, though it provides inconsistent relief.  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 She sleeps on her side with a side-sleeping pillow and has tried various sleeping positions to alleviate her symptoms. ***  {History (Optional):23778}  Medications: Outpatient Medications Prior to Visit  Medication Sig   escitalopram (LEXAPRO) 20 MG tablet TAKE 1 TABLET(20 MG) BY MOUTH AT BEDTIME   gabapentin (NEURONTIN) 100 MG capsule Take 200 mg by mouth 2 (two) times daily.   ibuprofen (ADVIL) 200 MG tablet Take 600 mg by mouth every 6 (six) hours as needed for headache or moderate pain.   phentermine 37.5 MG capsule Take 1 capsule (37.5 mg total) by mouth every morning.   Semaglutide-Weight Management 0.25 MG/0.5ML SOAJ Inject 0.25 mg into the skin once a week for 28 days.   Vitamin D, Ergocalciferol, (DRISDOL) 1.25 MG (50000 UNIT) CAPS capsule TAKE 1 CAPSULE BY MOUTH EVERY 7 DAYS   No facility-administered medications prior to visit.    Review of Systems ***  {Insert previous labs (optional):23779} {See past labs  Heme  Chem  Endocrine  Serology  Results Review (optional):1}   Objective    BP 119/69 (BP Location: Left Arm, Patient Position: Sitting, Cuff Size: Large)   Pulse 76   Resp 16   Ht 5\' 6"  (1.676 m)   Wt 243 lb 1.6 oz (110.3 kg)   LMP 04/21/2023   BMI 39.24 kg/m  {Insert last BP/Wt (optional):23777}{See vitals history (optional):1}   Physical Exam Vitals  and nursing note reviewed.  Constitutional:      General: She is not in acute distress.    Appearance: Normal appearance.  HENT:     Head: Normocephalic and atraumatic.  Eyes:     General: No scleral icterus.    Conjunctiva/sclera: Conjunctivae normal.  Cardiovascular:     Rate and Rhythm:  Normal rate.  Pulmonary:     Effort: Pulmonary effort is normal.  Musculoskeletal:     Cervical back: Muscular tenderness present.  Neurological:     Mental Status: She is alert and oriented to person, place, and time. Mental status is at baseline.  Psychiatric:        Mood and Affect: Mood normal.        Behavior: Behavior normal.      No results found for any visits on 04/25/23.  Assessment & Plan    There are no diagnoses linked to this encounter. Chronic Neck Pain with Radiating Symptoms Chronic neck pain for 4-5 years, radiating down the arm with numbness and tingling. Previous treatments include medications, physical therapy, chiropractic adjustments, and injections with limited relief. Pain is throbbing and sometimes associated with migraines. Previous imaging (CT scan) ruled out pinched nerves, but nerve damage was suggested. Current management includes ibuprofen with variable effectiveness. Considering muscle tension and stress as contributing factors. Discussed baclofen for muscle relaxation, potential side effects including fatigue, and advised to avoid driving when taking baclofen. Recommended starting baclofen on a night when she does not have to work the next day. Emphasized muscle relaxation, proper posture, and self-massage techniques. Considered referral for physical therapy for targeted massage and exercises. 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 Previous treatments include Effexor, nortriptyline, [name], gabapentin, clonazepam, and occipital nerve blocks with limited success.  Migraines have become less frequent but still occur weekly, lasting the entire day. Discussed potential use of Lyrica (pregabalin) if current management is insufficient. Advised to monitor and report migraine frequency and severity. - Continue current management with Lexapro - Discuss potential use of Lyrica if current management is insufficient - Advise to monitor and report migraine frequency and severity  General Health Maintenance Has not received flu, COVID booster, or tetanus vaccines recently. Last tetanus vaccine was in 2012. Works around cars and had a recent injury involving metal. Discussed the importance of regular vaccinations, including flu and COVID boosters. - Administer tetanus vaccine - Discuss the importance of regular vaccinations, including flu and COVID boosters - Screen for hepatitis C and HIV during blood work ***  No follow-ups on file.      I discussed the assessment and treatment plan with the patient  The patient was provided an opportunity to ask questions and all were answered. The patient agreed with the plan and demonstrated an understanding of the instructions.   The patient was advised to call back or seek an in-person evaluation if the symptoms worsen or if the condition fails to improve as anticipated.    Sherlyn Hay, DO  [facility] [phone] (phone) [phone] (fax)  [facility]

## 2023-04-26 ENCOUNTER — Encounter: Payer: Self-pay | Admitting: Family Medicine

## 2023-04-26 DIAGNOSIS — F913 Oppositional defiant disorder: Secondary | ICD-10-CM | POA: Insufficient documentation

## 2023-04-26 HISTORY — DX: Oppositional defiant disorder: F91.3

## 2023-04-26 NOTE — Assessment & Plan Note (Signed)
-   Order blood work to check vitamin D levels

## 2023-04-26 NOTE — Assessment & Plan Note (Signed)
 Suspected suboccipital neuralgia with associated migraines. Previous treatments include Effexor, nortriptyline, [name], gabapentin, clonazepam, and occipital nerve blocks with limited success. Migraines have become less frequent but still occur weekly, lasting the entire day. Discussed potential use of Lyrica (pregabalin) if current management is insufficient. Advised to monitor and report migraine frequency and severity. - Will attempt to treat in relation to muscle spasms (as noted above) as current symptoms seem more consistent with that. - Discussed potential use of Lyrica if current management is insufficient - Advise to monitor and report migraine frequency and severity

## 2023-04-26 NOTE — Assessment & Plan Note (Signed)
 Chronic neck pain for 4-5 years, radiating down the arm with numbness and tingling. Previous treatments include medications, physical therapy, chiropractic adjustments, and injections with limited relief. Pain is throbbing and sometimes associated with migraines. Previous imaging (CT scan) ruled out pinched nerves, but nerve damage was suggested. Current management includes ibuprofen with variable effectiveness. Considering muscle tension and stress as contributing factors. Discussed baclofen for muscle relaxation, potential side effects including fatigue, and advised to avoid driving when taking baclofen. Recommended starting baclofen on a night when she does not have to work the next day. Emphasized muscle relaxation, proper posture, and self-massage techniques. Considered referral for physical therapy for targeted massage and exercises. 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

## 2023-04-26 NOTE — Assessment & Plan Note (Signed)
 Stable and well-controlled. - Continue current management with Lexapro

## 2023-05-20 DIAGNOSIS — Z114 Encounter for screening for human immunodeficiency virus [HIV]: Secondary | ICD-10-CM | POA: Diagnosis not present

## 2023-05-20 DIAGNOSIS — Z1159 Encounter for screening for other viral diseases: Secondary | ICD-10-CM | POA: Diagnosis not present

## 2023-05-20 DIAGNOSIS — E559 Vitamin D deficiency, unspecified: Secondary | ICD-10-CM | POA: Diagnosis not present

## 2023-05-21 LAB — HIV ANTIBODY (ROUTINE TESTING W REFLEX): HIV Screen 4th Generation wRfx: NONREACTIVE

## 2023-05-21 LAB — HCV AB W REFLEX TO QUANT PCR: HCV Ab: NONREACTIVE

## 2023-05-21 LAB — VITAMIN D 25 HYDROXY (VIT D DEFICIENCY, FRACTURES): Vit D, 25-Hydroxy: 20.8 ng/mL — ABNORMAL LOW (ref 30.0–100.0)

## 2023-05-21 LAB — HCV INTERPRETATION

## 2023-05-23 ENCOUNTER — Other Ambulatory Visit: Payer: Self-pay | Admitting: Family Medicine

## 2023-05-23 ENCOUNTER — Encounter: Payer: Self-pay | Admitting: Family Medicine

## 2023-05-23 DIAGNOSIS — R197 Diarrhea, unspecified: Secondary | ICD-10-CM

## 2023-05-23 DIAGNOSIS — E559 Vitamin D deficiency, unspecified: Secondary | ICD-10-CM

## 2023-05-26 ENCOUNTER — Ambulatory Visit: Payer: BC Managed Care – PPO | Admitting: Family Medicine

## 2023-05-26 ENCOUNTER — Encounter: Payer: Self-pay | Admitting: Family Medicine

## 2023-05-26 VITALS — BP 121/71 | HR 90 | Ht 66.0 in | Wt 242.0 lb

## 2023-05-26 DIAGNOSIS — Z713 Dietary counseling and surveillance: Secondary | ICD-10-CM | POA: Diagnosis not present

## 2023-05-26 DIAGNOSIS — Z833 Family history of diabetes mellitus: Secondary | ICD-10-CM | POA: Diagnosis not present

## 2023-05-26 DIAGNOSIS — E559 Vitamin D deficiency, unspecified: Secondary | ICD-10-CM

## 2023-05-26 DIAGNOSIS — E669 Obesity, unspecified: Secondary | ICD-10-CM

## 2023-05-26 DIAGNOSIS — M62838 Other muscle spasm: Secondary | ICD-10-CM

## 2023-05-26 MED ORDER — NAPROXEN 500 MG PO TABS: 500.0000 mg | ORAL_TABLET | Freq: Two times a day (BID) | ORAL | 0 refills | Status: DC

## 2023-05-26 NOTE — Progress Notes (Signed)
 Established patient visit   Patient: NAHIMA ALES   DOB: 2001-02-23   22 y.o. Female  MRN: [id number] Visit Date: 05/26/2023  Today's healthcare provider: Sherlyn Hay, DO   Chief Complaint  Patient presents with   Follow-up    4 week neck spasm follow-up    Spasms    Pt was prescribes baclofen to be taken at night. Pt reports she has only taking a few times because it makes it hard to get up in the morning but she has also noticed that it does not help much. Reports spasms have been at a 9 this week with spasms becoming more frequent since last visit. Patient has been taking ibuprofen that helps at times and she is taking 800 mg (four 200 mg tablets). Yesterday she took 600 mg and 800 mg. Pain has caused vomiting episodes twice since lov and had to leave work.    Subjective    HPI AKIRRA LACERDA is a 23 year old female who presents with neck pain and vitamin D deficiency. She is accompanied by her mother.  She has been experiencing neck pain for several years, with recent episodes severe enough to cause vomiting. She uses ibuprofen, taking four 200 mg tablets as needed, almost daily. While ibuprofen provides some relief, its effectiveness varies, and she has not tried a high-dose NSAID regimen consistently. Muscle relaxers cause morning grogginess, making it difficult for her to use. She has attempted to adjust her sleeping position to alleviate the pain but often wakes up in uncomfortable positions. She has not tried different pillows yet.  She has a history of vitamin D deficiency. She initially took a prescribed course of vitamin D, believed to be 12 weeks long, but did not complete the full six-month course. She has some vitamin D left at home but is unsure of the exact amount. She has not been taking vitamin D consistently and is currently lower than her previous levels in May. She experiences occasional diarrhea, sometimes associated with her vitamin D intake.  She is  currently taking Lexapro and reports doing well on it.   Her mother has a history of diabetes. Her mother also mentions that their family often eats takeout and fast food due to busy schedules.  No increased urination, excessive thirst, or increased appetite. Occasional diarrhea is reported.     Medications: Outpatient Medications Prior to Visit  Medication Sig   baclofen (LIORESAL) 10 MG tablet Take 1 tablet (10 mg total) by mouth at bedtime as needed for muscle spasms.   escitalopram (LEXAPRO) 20 MG tablet TAKE 1 TABLET(20 MG) BY MOUTH AT BEDTIME   ibuprofen (ADVIL) 200 MG tablet Take 600 mg by mouth every 6 (six) hours as needed for headache or moderate pain.   gabapentin (NEURONTIN) 100 MG capsule Take 200 mg by mouth 2 (two) times daily. (Patient not taking: Reported on 05/26/2023)   phentermine 37.5 MG capsule Take 1 capsule (37.5 mg total) by mouth every morning. (Patient not taking: Reported on 05/26/2023)   Semaglutide-Weight Management 0.25 MG/0.5ML SOAJ Inject 0.25 mg into the skin once a week for 28 days.   [DISCONTINUED] Vitamin D, Ergocalciferol, (DRISDOL) 1.25 MG (50000 UNIT) CAPS capsule TAKE 1 CAPSULE BY MOUTH EVERY 7 DAYS (Patient not taking: Reported on 05/26/2023)   No facility-administered medications prior to visit.        Objective    BP 121/71 (BP Location: Right Arm, Patient Position: Sitting, Cuff Size: Normal)  Pulse 90   Ht 5\' 6"  (1.676 m)   Wt 242 lb (109.8 kg)   SpO2 97%   BMI 39.06 kg/m     Physical Exam Vitals and nursing note reviewed.  Constitutional:      General: She is not in acute distress.    Appearance: Normal appearance.  HENT:     Head: Normocephalic and atraumatic.  Eyes:     General: No scleral icterus.    Conjunctiva/sclera: Conjunctivae normal.  Cardiovascular:     Rate and Rhythm: Normal rate.  Pulmonary:     Effort: Pulmonary effort is normal.  Neurological:     Mental Status: She is alert and oriented to person, place,  and time. Mental status is at baseline.  Psychiatric:        Mood and Affect: Mood normal.        Behavior: Behavior normal.      No results found for any visits on 05/26/23.  Assessment & Plan    Muscle spasms of neck -     Naproxen; Take 1 tablet (500 mg total) by mouth 2 (two) times daily with a meal.  Dispense: 14 tablet; Refill: 0 -     Ambulatory referral to Physical Therapy  Vitamin D deficiency  Family history of diabetes mellitus (DM) -     Hemoglobin A1c  Weight loss counseling, encounter for  Obesity (BMI 35.0-39.9 without comorbidity) -     Amb ref to Medical Nutrition Therapy-MNT -     Hemoglobin A1c     Muscle spasms of neck Chronic neck pain with episodes of muscle spasms and vomiting due to pain. Pain is sometimes relieved by ibuprofen. No previous trial of high-dose NSAIDs for inflammation control. Sleeping position and pillow type may contribute to pain. Discussed the option of using naproxen instead of ibuprofen for ease of dosing. Informed about the risk of gastrointestinal bleeding with NSAID use, especially while on Lexapro, and symptoms to watch for such as black tarry stools or coffee ground emesis. - Prescribe naproxen 500 mg, one tablet twice a day for one week. - Consider different pillow types to improve sleeping posture. - Send referral for physical therapy for neck pain management.  Vitamin D Deficiency Vitamin D deficiency with previous incomplete course of high-dose vitamin D. Current levels are lower than previous measurements. Discussed vitamin D absorption and potential benefits of vitamin K supplementation. Considered switching to vitamin D3 5000 IU daily for better absorption and ease of adherence. - Take vitamin D3 5000 IU daily over-the-counter. - Take vitamin K 100 mcg daily. - Recheck vitamin D levels in 3 to 6 months.  Weight loss counseling, encounter for; obesity (BMI 35.0-39.9 without comorbidity) Discussion on weight management  options including previous adverse reaction to phentermine and insurance issues with QIONGE. Consideration of lifestyle changes and potential referral to a nutritionist. Discussed alternative medications like Zepbound and Contrave, and the importance of checking insurance formulary for coverage. Emphasized the role of dietary changes and the potential benefit of consulting a nutritionist. - Call insurance to check formulary for weight management medications like Wegovy, Zepbound, and Contrave. - Send referral to a nutritionist for dietary guidance. - Discuss potential lower dose of phentermine if desired.  General Health Maintenance Monitoring of blood glucose levels due to family history of diabetes. Considered the need for A1c testing to monitor for prediabetes given borderline previous results. - Order A1c test to monitor blood glucose levels. - Refer to a nutritionist for dietary guidance.  Return in about 6 months (around 11/26/2023) for vit D, neck f/u.      I discussed the assessment and treatment plan with the patient  The patient was provided an opportunity to ask questions and all were answered. The patient agreed with the plan and demonstrated an understanding of the instructions.   The patient was advised to call back or seek an in-person evaluation if the symptoms worsen or if the condition fails to improve as anticipated.    Sherlyn Hay, DO  [facility] [phone] (phone) [phone] (fax)  [facility] Margorie John

## 2023-05-30 ENCOUNTER — Other Ambulatory Visit: Payer: Self-pay | Admitting: Family Medicine

## 2023-05-30 DIAGNOSIS — M62838 Other muscle spasm: Secondary | ICD-10-CM

## 2023-05-31 NOTE — Telephone Encounter (Signed)
 Requested medication (s) are due for refill today: no  Requested medication (s) are on the active medication list: yes  Last refill:  05/26/23 #14  Future visit scheduled: no  Notes to clinic:  overdue lab work    Requested Prescriptions  Pending Prescriptions Disp Refills   naproxen (NAPROSYN) 500 MG tablet [Pharmacy Med Name: NAPROXEN 500MG  TABLETS] 14 tablet 0    Sig: TAKE 1 TABLET(500 MG) BY MOUTH TWICE DAILY WITH A MEAL     Analgesics:  NSAIDS Failed - 05/31/2023 11:01 AM      Failed - Manual Review: Labs are only required if the patient has taken medication for more than 8 weeks.      Failed - HGB in normal range and within 360 days    Hemoglobin  Date Value Ref Range Status  08/23/2019 13.1 12.0 - 15.0 g/dL Final         Failed - PLT in normal range and within 360 days    Platelets  Date Value Ref Range Status  08/23/2019 221 150 - 400 K/uL Final         Failed - HCT in normal range and within 360 days    HCT  Date Value Ref Range Status  08/23/2019 38.2 36.0 - 46.0 % Final         Passed - Cr in normal range and within 360 days    Creatinine, Ser  Date Value Ref Range Status  07/23/2022 0.80 0.57 - 1.00 mg/dL Final         Passed - eGFR is 30 or above and within 360 days    GFR calc Af Amer  Date Value Ref Range Status  08/23/2019 >60 >60 mL/min Final   GFR calc non Af Amer  Date Value Ref Range Status  08/23/2019 >60 >60 mL/min Final   eGFR  Date Value Ref Range Status  07/23/2022 107 >59 mL/min/1.73 Final         Passed - Patient is not pregnant      Passed - Valid encounter within last 12 months    Recent Outpatient Visits           9 months ago Encounter for well woman exam with abnormal findings   [facility] [place], Monico Blitz, DO   10 months ago Generalized anxiety disorder   [facility] [place], Monico Blitz, DO

## 2023-06-12 ENCOUNTER — Encounter: Payer: Self-pay | Admitting: Family Medicine

## 2023-08-05 DIAGNOSIS — F17290 Nicotine dependence, other tobacco product, uncomplicated: Secondary | ICD-10-CM | POA: Diagnosis not present

## 2023-08-05 DIAGNOSIS — F419 Anxiety disorder, unspecified: Secondary | ICD-10-CM | POA: Diagnosis not present

## 2023-08-05 DIAGNOSIS — S9031XA Contusion of right foot, initial encounter: Secondary | ICD-10-CM | POA: Diagnosis not present

## 2023-08-05 DIAGNOSIS — F17210 Nicotine dependence, cigarettes, uncomplicated: Secondary | ICD-10-CM | POA: Diagnosis not present

## 2023-08-05 DIAGNOSIS — X58XXXA Exposure to other specified factors, initial encounter: Secondary | ICD-10-CM | POA: Diagnosis not present

## 2023-08-05 DIAGNOSIS — M79671 Pain in right foot: Secondary | ICD-10-CM | POA: Diagnosis not present

## 2023-08-05 DIAGNOSIS — S99921A Unspecified injury of right foot, initial encounter: Secondary | ICD-10-CM | POA: Diagnosis not present

## 2023-11-04 ENCOUNTER — Other Ambulatory Visit: Payer: Self-pay | Admitting: Family Medicine

## 2023-11-04 DIAGNOSIS — F411 Generalized anxiety disorder: Secondary | ICD-10-CM

## 2023-11-04 DIAGNOSIS — F410 Panic disorder [episodic paroxysmal anxiety] without agoraphobia: Secondary | ICD-10-CM

## 2023-11-04 NOTE — Telephone Encounter (Signed)
 LOV 05/26/23 NOV none on file  [facility] 04/21/23 LABS  05/23/23

## 2023-11-22 DIAGNOSIS — M25312 Other instability, left shoulder: Secondary | ICD-10-CM | POA: Diagnosis not present

## 2023-11-22 DIAGNOSIS — M25512 Pain in left shoulder: Secondary | ICD-10-CM | POA: Diagnosis not present

## 2024-03-05 DIAGNOSIS — S0502XA Injury of conjunctiva and corneal abrasion without foreign body, left eye, initial encounter: Secondary | ICD-10-CM | POA: Diagnosis not present

## 2024-03-21 ENCOUNTER — Encounter: Payer: Self-pay | Admitting: Family Medicine

## 2024-03-21 ENCOUNTER — Ambulatory Visit: Admitting: Family Medicine

## 2024-03-21 VITALS — BP 104/69 | HR 86 | Temp 98.4°F | Ht 66.0 in | Wt 231.7 lb

## 2024-03-21 DIAGNOSIS — E66812 Obesity, class 2: Secondary | ICD-10-CM | POA: Diagnosis not present

## 2024-03-21 DIAGNOSIS — E559 Vitamin D deficiency, unspecified: Secondary | ICD-10-CM

## 2024-03-21 DIAGNOSIS — M62838 Other muscle spasm: Secondary | ICD-10-CM

## 2024-03-21 DIAGNOSIS — G8929 Other chronic pain: Secondary | ICD-10-CM | POA: Diagnosis not present

## 2024-03-21 DIAGNOSIS — Z23 Encounter for immunization: Secondary | ICD-10-CM

## 2024-03-21 DIAGNOSIS — M5481 Occipital neuralgia: Secondary | ICD-10-CM | POA: Diagnosis not present

## 2024-03-21 DIAGNOSIS — F411 Generalized anxiety disorder: Secondary | ICD-10-CM

## 2024-03-21 DIAGNOSIS — M542 Cervicalgia: Secondary | ICD-10-CM | POA: Diagnosis not present

## 2024-03-21 DIAGNOSIS — Z136 Encounter for screening for cardiovascular disorders: Secondary | ICD-10-CM

## 2024-03-21 DIAGNOSIS — F330 Major depressive disorder, recurrent, mild: Secondary | ICD-10-CM | POA: Diagnosis not present

## 2024-03-21 DIAGNOSIS — Z13228 Encounter for screening for other metabolic disorders: Secondary | ICD-10-CM

## 2024-03-21 DIAGNOSIS — E669 Obesity, unspecified: Secondary | ICD-10-CM

## 2024-03-21 DIAGNOSIS — G43009 Migraine without aura, not intractable, without status migrainosus: Secondary | ICD-10-CM

## 2024-03-21 DIAGNOSIS — F410 Panic disorder [episodic paroxysmal anxiety] without agoraphobia: Secondary | ICD-10-CM | POA: Diagnosis not present

## 2024-03-21 DIAGNOSIS — E282 Polycystic ovarian syndrome: Secondary | ICD-10-CM

## 2024-03-21 MED ORDER — ESCITALOPRAM OXALATE 20 MG PO TABS: ORAL_TABLET | ORAL | 3 refills | Status: AC
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
